# Patient Record
Sex: Female | Born: 1965
Health system: Southern US, Community
[De-identification: ages and names within clinical notes are randomized; demographics above are authoritative.]

## PROBLEM LIST (undated history)

## (undated) DIAGNOSIS — M5030 Other cervical disc degeneration, unspecified cervical region: Secondary | ICD-10-CM

## (undated) DIAGNOSIS — K219 Gastro-esophageal reflux disease without esophagitis: Secondary | ICD-10-CM

## (undated) DIAGNOSIS — M8580 Other specified disorders of bone density and structure, unspecified site: Secondary | ICD-10-CM

## (undated) DIAGNOSIS — M1990 Unspecified osteoarthritis, unspecified site: Secondary | ICD-10-CM

## (undated) DIAGNOSIS — E282 Polycystic ovarian syndrome: Secondary | ICD-10-CM

## (undated) DIAGNOSIS — T7840XA Allergy, unspecified, initial encounter: Secondary | ICD-10-CM

## (undated) DIAGNOSIS — I10 Essential (primary) hypertension: Secondary | ICD-10-CM

## (undated) DIAGNOSIS — Z8489 Family history of other specified conditions: Secondary | ICD-10-CM

## (undated) DIAGNOSIS — C801 Malignant (primary) neoplasm, unspecified: Secondary | ICD-10-CM

## (undated) HISTORY — DX: Other specified disorders of bone density and structure, unspecified site: M85.80

## (undated) HISTORY — DX: Other cervical disc degeneration, unspecified cervical region: M50.30

## (undated) HISTORY — PX: BREAST ENHANCEMENT SURGERY: SHX7

## (undated) HISTORY — DX: Polycystic ovarian syndrome: E28.2

## (undated) HISTORY — DX: Unspecified osteoarthritis, unspecified site: M19.90

## (undated) HISTORY — PX: HAND SURGERY: SHX662

## (undated) HISTORY — DX: Allergy, unspecified, initial encounter: T78.40XA

## (undated) HISTORY — PX: TONSILLECTOMY: SUR1361

---

## 2018-06-02 DIAGNOSIS — C801 Malignant (primary) neoplasm, unspecified: Secondary | ICD-10-CM | POA: Diagnosis not present

## 2018-06-02 DIAGNOSIS — F419 Anxiety disorder, unspecified: Secondary | ICD-10-CM | POA: Diagnosis not present

## 2018-06-02 DIAGNOSIS — G43909 Migraine, unspecified, not intractable, without status migrainosus: Secondary | ICD-10-CM | POA: Diagnosis not present

## 2018-06-02 DIAGNOSIS — F5101 Primary insomnia: Secondary | ICD-10-CM | POA: Diagnosis not present

## 2018-07-11 DIAGNOSIS — F3342 Major depressive disorder, recurrent, in full remission: Secondary | ICD-10-CM | POA: Diagnosis not present

## 2018-08-03 DIAGNOSIS — M542 Cervicalgia: Secondary | ICD-10-CM | POA: Diagnosis not present

## 2018-08-03 DIAGNOSIS — M79606 Pain in leg, unspecified: Secondary | ICD-10-CM | POA: Diagnosis not present

## 2018-08-09 DIAGNOSIS — M25562 Pain in left knee: Secondary | ICD-10-CM | POA: Diagnosis not present

## 2018-08-09 DIAGNOSIS — G43009 Migraine without aura, not intractable, without status migrainosus: Secondary | ICD-10-CM | POA: Diagnosis not present

## 2018-08-09 DIAGNOSIS — M47812 Spondylosis without myelopathy or radiculopathy, cervical region: Secondary | ICD-10-CM | POA: Diagnosis not present

## 2018-08-09 DIAGNOSIS — Z79891 Long term (current) use of opiate analgesic: Secondary | ICD-10-CM | POA: Diagnosis not present

## 2018-08-09 DIAGNOSIS — G894 Chronic pain syndrome: Secondary | ICD-10-CM | POA: Diagnosis not present

## 2018-09-05 DIAGNOSIS — G894 Chronic pain syndrome: Secondary | ICD-10-CM | POA: Diagnosis not present

## 2018-09-05 DIAGNOSIS — M25562 Pain in left knee: Secondary | ICD-10-CM | POA: Diagnosis not present

## 2018-09-05 DIAGNOSIS — M47812 Spondylosis without myelopathy or radiculopathy, cervical region: Secondary | ICD-10-CM | POA: Diagnosis not present

## 2018-09-05 DIAGNOSIS — G43009 Migraine without aura, not intractable, without status migrainosus: Secondary | ICD-10-CM | POA: Diagnosis not present

## 2018-09-12 DIAGNOSIS — F3342 Major depressive disorder, recurrent, in full remission: Secondary | ICD-10-CM | POA: Diagnosis not present

## 2018-10-04 DIAGNOSIS — G894 Chronic pain syndrome: Secondary | ICD-10-CM | POA: Diagnosis not present

## 2018-10-04 DIAGNOSIS — M47812 Spondylosis without myelopathy or radiculopathy, cervical region: Secondary | ICD-10-CM | POA: Diagnosis not present

## 2018-10-04 DIAGNOSIS — M25562 Pain in left knee: Secondary | ICD-10-CM | POA: Diagnosis not present

## 2018-10-04 DIAGNOSIS — G43009 Migraine without aura, not intractable, without status migrainosus: Secondary | ICD-10-CM | POA: Diagnosis not present

## 2018-11-01 DIAGNOSIS — M47812 Spondylosis without myelopathy or radiculopathy, cervical region: Secondary | ICD-10-CM | POA: Diagnosis not present

## 2018-11-01 DIAGNOSIS — M25562 Pain in left knee: Secondary | ICD-10-CM | POA: Diagnosis not present

## 2018-11-01 DIAGNOSIS — G43009 Migraine without aura, not intractable, without status migrainosus: Secondary | ICD-10-CM | POA: Diagnosis not present

## 2018-11-01 DIAGNOSIS — G894 Chronic pain syndrome: Secondary | ICD-10-CM | POA: Diagnosis not present

## 2018-11-30 DIAGNOSIS — G43009 Migraine without aura, not intractable, without status migrainosus: Secondary | ICD-10-CM | POA: Diagnosis not present

## 2018-11-30 DIAGNOSIS — G894 Chronic pain syndrome: Secondary | ICD-10-CM | POA: Diagnosis not present

## 2018-11-30 DIAGNOSIS — M47812 Spondylosis without myelopathy or radiculopathy, cervical region: Secondary | ICD-10-CM | POA: Diagnosis not present

## 2018-11-30 DIAGNOSIS — M25562 Pain in left knee: Secondary | ICD-10-CM | POA: Diagnosis not present

## 2018-12-12 DIAGNOSIS — F3342 Major depressive disorder, recurrent, in full remission: Secondary | ICD-10-CM | POA: Diagnosis not present

## 2018-12-28 DIAGNOSIS — M25562 Pain in left knee: Secondary | ICD-10-CM | POA: Diagnosis not present

## 2018-12-28 DIAGNOSIS — G894 Chronic pain syndrome: Secondary | ICD-10-CM | POA: Diagnosis not present

## 2018-12-28 DIAGNOSIS — M47812 Spondylosis without myelopathy or radiculopathy, cervical region: Secondary | ICD-10-CM | POA: Diagnosis not present

## 2018-12-28 DIAGNOSIS — G43009 Migraine without aura, not intractable, without status migrainosus: Secondary | ICD-10-CM | POA: Diagnosis not present

## 2019-01-25 DIAGNOSIS — G43009 Migraine without aura, not intractable, without status migrainosus: Secondary | ICD-10-CM | POA: Diagnosis not present

## 2019-01-25 DIAGNOSIS — G894 Chronic pain syndrome: Secondary | ICD-10-CM | POA: Diagnosis not present

## 2019-01-25 DIAGNOSIS — M47812 Spondylosis without myelopathy or radiculopathy, cervical region: Secondary | ICD-10-CM | POA: Diagnosis not present

## 2019-01-25 DIAGNOSIS — M25562 Pain in left knee: Secondary | ICD-10-CM | POA: Diagnosis not present

## 2019-01-26 DIAGNOSIS — E282 Polycystic ovarian syndrome: Secondary | ICD-10-CM | POA: Diagnosis not present

## 2019-01-26 DIAGNOSIS — C801 Malignant (primary) neoplasm, unspecified: Secondary | ICD-10-CM | POA: Diagnosis not present

## 2019-01-26 DIAGNOSIS — G43909 Migraine, unspecified, not intractable, without status migrainosus: Secondary | ICD-10-CM | POA: Diagnosis not present

## 2019-01-26 DIAGNOSIS — L700 Acne vulgaris: Secondary | ICD-10-CM | POA: Diagnosis not present

## 2019-02-09 ENCOUNTER — Other Ambulatory Visit (HOSPITAL_COMMUNITY)
Admission: RE | Admit: 2019-02-09 | Discharge: 2019-02-09 | Disposition: A | Discharge status: Home / Self Care | Payer: Self-pay | Source: Ambulatory Visit | Attending: Obstetrics and Gynecology | Admitting: Obstetrics and Gynecology

## 2019-02-09 ENCOUNTER — Other Ambulatory Visit: Payer: Self-pay | Admitting: Obstetrics and Gynecology

## 2019-02-09 DIAGNOSIS — N959 Unspecified menopausal and perimenopausal disorder: Secondary | ICD-10-CM | POA: Diagnosis not present

## 2019-02-09 DIAGNOSIS — Z124 Encounter for screening for malignant neoplasm of cervix: Secondary | ICD-10-CM | POA: Insufficient documentation

## 2019-02-09 DIAGNOSIS — E282 Polycystic ovarian syndrome: Secondary | ICD-10-CM | POA: Diagnosis not present

## 2019-02-09 DIAGNOSIS — M8580 Other specified disorders of bone density and structure, unspecified site: Secondary | ICD-10-CM | POA: Diagnosis not present

## 2019-02-09 DIAGNOSIS — Z01419 Encounter for gynecological examination (general) (routine) without abnormal findings: Secondary | ICD-10-CM | POA: Diagnosis not present

## 2019-02-11 LAB — CYTOLOGY - PAP
Diagnosis: NEGATIVE
HPV: NOT DETECTED

## 2019-02-22 DIAGNOSIS — Z79891 Long term (current) use of opiate analgesic: Secondary | ICD-10-CM | POA: Diagnosis not present

## 2019-02-22 DIAGNOSIS — M47812 Spondylosis without myelopathy or radiculopathy, cervical region: Secondary | ICD-10-CM | POA: Diagnosis not present

## 2019-02-22 DIAGNOSIS — M25562 Pain in left knee: Secondary | ICD-10-CM | POA: Diagnosis not present

## 2019-02-22 DIAGNOSIS — G43009 Migraine without aura, not intractable, without status migrainosus: Secondary | ICD-10-CM | POA: Diagnosis not present

## 2019-02-22 DIAGNOSIS — G894 Chronic pain syndrome: Secondary | ICD-10-CM | POA: Diagnosis not present

## 2019-02-27 DIAGNOSIS — E673 Hypervitaminosis D: Secondary | ICD-10-CM | POA: Diagnosis not present

## 2019-03-20 DIAGNOSIS — R7309 Other abnormal glucose: Secondary | ICD-10-CM | POA: Diagnosis not present

## 2019-03-21 DIAGNOSIS — M47812 Spondylosis without myelopathy or radiculopathy, cervical region: Secondary | ICD-10-CM | POA: Diagnosis not present

## 2019-03-21 DIAGNOSIS — M25562 Pain in left knee: Secondary | ICD-10-CM | POA: Diagnosis not present

## 2019-03-21 DIAGNOSIS — G894 Chronic pain syndrome: Secondary | ICD-10-CM | POA: Diagnosis not present

## 2019-03-21 DIAGNOSIS — G43009 Migraine without aura, not intractable, without status migrainosus: Secondary | ICD-10-CM | POA: Diagnosis not present

## 2019-03-28 DIAGNOSIS — L299 Pruritus, unspecified: Secondary | ICD-10-CM | POA: Diagnosis not present

## 2019-03-28 DIAGNOSIS — T7840XA Allergy, unspecified, initial encounter: Secondary | ICD-10-CM | POA: Diagnosis not present

## 2019-03-28 DIAGNOSIS — R0602 Shortness of breath: Secondary | ICD-10-CM | POA: Diagnosis not present

## 2019-03-28 DIAGNOSIS — R000 Tachycardia, unspecified: Secondary | ICD-10-CM | POA: Diagnosis not present

## 2019-04-10 DIAGNOSIS — F3342 Major depressive disorder, recurrent, in full remission: Secondary | ICD-10-CM | POA: Diagnosis not present

## 2019-04-12 DIAGNOSIS — M79671 Pain in right foot: Secondary | ICD-10-CM | POA: Diagnosis not present

## 2019-04-13 ENCOUNTER — Other Ambulatory Visit: Payer: Self-pay

## 2019-04-17 ENCOUNTER — Other Ambulatory Visit: Payer: Self-pay | Admitting: Family Medicine

## 2019-04-17 ENCOUNTER — Ambulatory Visit
Admission: RE | Admit: 2019-04-17 | Discharge: 2019-04-17 | Disposition: A | Discharge status: Home / Self Care | Payer: BLUE CROSS/BLUE SHIELD | Source: Ambulatory Visit | Attending: Family Medicine | Admitting: Family Medicine

## 2019-04-17 ENCOUNTER — Other Ambulatory Visit: Payer: Self-pay

## 2019-04-17 ENCOUNTER — Encounter: Payer: Self-pay | Admitting: Internal Medicine

## 2019-04-17 ENCOUNTER — Ambulatory Visit (INDEPENDENT_AMBULATORY_CARE_PROVIDER_SITE_OTHER): Payer: BLUE CROSS/BLUE SHIELD | Admitting: Internal Medicine

## 2019-04-17 ENCOUNTER — Telehealth: Payer: Self-pay

## 2019-04-17 VITALS — BP 128/74 | HR 98 | Resp 16 | Ht 61.0 in | Wt 102.8 lb

## 2019-04-17 DIAGNOSIS — E282 Polycystic ovarian syndrome: Secondary | ICD-10-CM | POA: Diagnosis not present

## 2019-04-17 DIAGNOSIS — M79671 Pain in right foot: Secondary | ICD-10-CM

## 2019-04-17 DIAGNOSIS — E673 Hypervitaminosis D: Secondary | ICD-10-CM | POA: Diagnosis not present

## 2019-04-17 LAB — COMPREHENSIVE METABOLIC PANEL
ALT: 15 U/L (ref 0–35)
AST: 14 U/L (ref 0–37)
Albumin: 4.2 g/dL (ref 3.5–5.2)
Alkaline Phosphatase: 41 U/L (ref 39–117)
BUN: 18 mg/dL (ref 6–23)
CO2: 27 mEq/L (ref 19–32)
Calcium: 9.4 mg/dL (ref 8.4–10.5)
Chloride: 104 mEq/L (ref 96–112)
Creatinine, Ser: 0.95 mg/dL (ref 0.40–1.20)
GFR: 61.11 mL/min (ref >60.00)
Glucose, Bld: 82 mg/dL (ref 70–99)
Potassium: 3.4 mEq/L — ABNORMAL LOW (ref 3.5–5.1)
Sodium: 140 mEq/L (ref 135–145)
Total Bilirubin: 0.3 mg/dL (ref 0.2–1.2)
Total Protein: 6.7 g/dL (ref 6.0–8.3)

## 2019-04-17 LAB — T4, FREE: Free T4: 0.91 ng/dL (ref 0.60–1.60)

## 2019-04-17 LAB — PHOSPHORUS: Phosphorus: 2.4 mg/dL (ref 2.3–4.6)

## 2019-04-17 LAB — VITAMIN D 25 HYDROXY (VIT D DEFICIENCY, FRACTURES): VITD: >120 ng/mL

## 2019-04-17 LAB — TSH: TSH: 0.47 u[IU]/mL (ref 0.35–4.50)

## 2019-04-17 MED ORDER — METFORMIN HCL ER 500 MG PO TB24: 1500.0000 mg | ORAL_TABLET | Freq: Every day | ORAL | 3 refills | Status: DC

## 2019-04-17 NOTE — Progress Notes (Signed)
Name: Shelly Myers  MRN/ DOB: [id number], [birth year]    Age/ Sex: 53 y.o., female    PCP: Shelly Lund, PA   Reason for Endocrinology Evaluation: Hypervitaminosis D     Date of Initial Endocrinology Evaluation: 04/17/2019     HPI: Shelly Myers is a 53 y.o. female with a past medical history of insomnia,hx of PCOS, migraine headaches and anxiety . The patient presented for initial endocrinology clinic visit on 04/17/2019 for consultative assistance with her elevated Vitamin D   Pt has been referred by Dr. Dion Body for elevated Vitamin D levels > 120 ng/dL   She denies hypercalcemia, polyuria or polydipsia Denies confusion or renal stones  Has been diagnosed with osteopenia. Has been on OTC calcium and vitamin D , takes 1 tabs daily- stopped after elevated Vitamin D results  She consumes dairy on average 2-3 servings per day.  They own a tanning bed and uses it once a week and uses a sunscreen only on her chest.  Pt has been diagnosed with PCOS in [place] , was following with Endo there. Has been on Metformin for years.  Has 1 child without difficulty conceiving       HISTORY:  Past Medical History:  Past Medical History:  Diagnosis Date  . Allergy   . Arthritis   . DDD (degenerative disc disease), cervical   . Osteopenia   . PCOS (polycystic ovarian syndrome)     Past Surgical History:  N/A  Social History:  reports that she has never smoked. She has never used smokeless tobacco.  Family History: family history includes Diabetes in her father; Esophageal cancer in her father; Hyperlipidemia in her father; Hypertension in her father and mother; Osteopenia in her mother; Prostate cancer in her father; Skin cancer in her father.   HOME MEDICATIONS: Allergies as of 04/17/2019   No Known Allergies     Medication List       Accurate as of April 17, 2019  2:39 PM. If you have any questions, ask your nurse or doctor.        Aimovig 140 MG/ML Soaj  Generic drug: Erenumab-aooe Inject into the skin every 30 (thirty) days.   ALPRAZolam 1 MG tablet Commonly known as: XANAX Take 2 mg by mouth at bedtime.   clindamycin 1 % external solution Commonly known as: CLEOCIN T APPLY TWICE A DAY EXTERNALLY 30 DAYS   cyclobenzaprine 10 MG tablet Commonly known as: FLEXERIL TAKE 1/2 TABLET BY MOUTH EVERY MORNING AND AT NOON, AND TAKE 1 TABLET EVERY DAY AT BEDTIME   EPINEPHrine 0.3 mg/0.3 mL Soaj injection Commonly known as: EPI-PEN INJECT AS DIRECTED FOR LIFE THREATENING REACTION   fluticasone 50 MCG/ACT nasal spray Commonly known as: FLONASE Place 2 sprays into both nostrils as needed.   meloxicam 15 MG tablet Commonly known as: MOBIC Take 15 mg by mouth daily.   metFORMIN 500 MG 24 hr tablet Commonly known as: GLUCOPHAGE-XR Take 1,500 mg by mouth daily.   Nurtec 75 MG Tbdp Generic drug: Rimegepant Sulfate Take 1 tablet by mouth daily as needed.   nystatin-triamcinolone ointment Commonly known as: MYCOLOG APPLY TOPICALLY TO THE AFFECTED AREA 2 TIMES A DAY.   oxyCODONE-acetaminophen 10-325 MG tablet Commonly known as: PERCOCET Take 1 tablet by mouth 2 (two) times daily as needed.   Prempro 0.45-1.5 MG tablet Generic drug: estrogen (conjugated)-medroxyprogesterone Take 1 tablet by mouth daily.   traZODone 150 MG tablet Commonly known as: DESYREL Take 300 mg by  mouth at bedtime.   triamcinolone cream 0.5 % Commonly known as: KENALOG APPLY TO AFFECTED AREA TWICE DAILY AS NEEDED   Trintellix 20 MG Tabs tablet Generic drug: vortioxetine HBr Take 20 mg by mouth daily.   Xtampza ER 18 MG C12a Generic drug: oxyCODONE ER Take 1 capsule by mouth every 12 (twelve) hours.         REVIEW OF SYSTEMS: A comprehensive ROS was conducted with the patient and is negative except as per HPI and below:  Review of Systems  Constitutional: Negative for malaise/fatigue and weight loss.  HENT: Negative for congestion and sore  throat.   Respiratory: Negative for cough and shortness of breath.   Cardiovascular: Negative for chest pain and palpitations.  Gastrointestinal: Positive for constipation. Negative for diarrhea and nausea.  Genitourinary: Negative for frequency.  Neurological: Positive for headaches. Negative for tingling.  Endo/Heme/Allergies: Negative for polydipsia.  Psychiatric/Behavioral: Negative for depression. The patient is not nervous/anxious.        OBJECTIVE:  VS: BP 128/74   Pulse 98   Resp 16   Ht 5\' 1"  (1.549 m)   Wt 102 lb 12.8 oz (46.6 kg)   SpO2 96%   BMI 19.42 kg/m    Wt Readings from Last 3 Encounters:  04/17/19 102 lb 12.8 oz (46.6 kg)     EXAM: General: Pt appears well and is in NAD  Hydration: Well-hydrated with moist mucous membranes and good skin turgor  Eyes: External eye exam normal without stare, lid lag or exophthalmos.  EOM intact.  PERRL.  Ears, Nose, Throat: Hearing: Grossly intact bilaterally Dental: Good dentition  Throat: Clear without mass, erythema or exudate  Neck: General: Supple without adenopathy. Thyroid: Thyroid size normal.  No goiter or nodules appreciated. No thyroid bruit.  Lungs: Clear with good BS bilat with no rales, rhonchi, or wheezes  Heart: Auscultation: RRR.  Abdomen: Normoactive bowel sounds, soft, nontender, without masses or organomegaly palpable  Extremities:  BL LE: No pretibial edema normal ROM and strength.  Skin: Hair: Texture and amount normal with gender appropriate distribution Skin Inspection: No rashes,excessively tanned Skin Palpation: Skin temperature, texture, and thickness normal to palpation  Neuro: Cranial nerves: II - XII grossly intact  Motor: Normal strength throughout DTRs: 2+ and symmetric in UE without delay in relaxation phase  Mental Status: Judgment, insight: Intact Orientation: Oriented to time, place, and person Mood and affect: No depression, anxiety, or agitation     DATA REVIEWED:  Results  for [name], [name] (MRN [id number]) as of 04/18/2019 09:51  Ref. Range 04/17/2019 14:58  Sodium Latest Ref Range: 135 - 145 mEq/L 140  Potassium Latest Ref Range: 3.5 - 5.1 mEq/L 3.4 (L)  Chloride Latest Ref Range: 96 - 112 mEq/L 104  CO2 Latest Ref Range: 19 - 32 mEq/L 27  Glucose Latest Ref Range: 70 - 99 mg/dL 82  BUN Latest Ref Range: 6 - 23 mg/dL 18  Creatinine Latest Ref Range: 0.40 - 1.20 mg/dL 0.95  Calcium Latest Ref Range: 8.4 - 10.5 mg/dL 9.4  Phosphorus Latest Ref Range: 2.3 - 4.6 mg/dL 2.4  Alkaline Phosphatase Latest Ref Range: 39 - 117 U/L 41  Albumin Latest Ref Range: 3.5 - 5.2 g/dL 4.2  AST Latest Ref Range: 0 - 37 U/L 14  ALT Latest Ref Range: 0 - 35 U/L 15  Total Protein Latest Ref Range: 6.0 - 8.3 g/dL 6.7  Total Bilirubin Latest Ref Range: 0.2 - 1.2 mg/dL 0.3  GFR Latest Ref Range: >  60.00 mL/min 61.11  VITD Latest Units: ng/mL >120  TSH Latest Ref Range: 0.35 - 4.50 uIU/mL 0.47  T4,Free(Direct) Latest Ref Range: 0.60 - 1.60 ng/dL 4.090.91     8/11/919/10/20 Vitamin D 120 ng/mL  A1c 5.1%  ASSESSMENT/PLAN/RECOMMENDATIONS:   1. Hypervitaminosis D:  - We discussed the risk of hypervitaminosis D in causing hypercalcemia and renal stones, with the subsequent risk of renal damage.  - Pt had already stopped all OTC renal supplements , repeat Vitamin D continues to be > 120 ng/mL, pt will be advised to stop tanning completely until her next check up in 3 months    2. PCOS:   - This was diagnosed years ago. Was seen by Endo in [place] - Pt is on metformin and HRT. Pt would like to continue on metformin, she feels this has helped keep her weight down.    Medications : Metformin 500 mg, 3 tabs daily     F/U in 3 months     Signed electronically by: Lyndle HerrlichAbby Jaralla Manolito Jurewicz, MD  [facility]  [facility] [address] [place], [place] [address] Phone: [phone] FAX: [phone]   CC: Shelly LundBecker, Anna G, [place] [address]  [place] [id number] Phone: [phone] Fax: [phone]   Return to Endocrinology clinic as below: No future appointments.

## 2019-04-17 NOTE — Telephone Encounter (Signed)
Main lab called with a critical vit d greater than 120.  Also sent Dr. Dwyane Dee a message since he's on call.

## 2019-04-18 ENCOUNTER — Telehealth: Payer: Self-pay | Admitting: Internal Medicine

## 2019-04-18 LAB — PARATHYROID HORMONE, INTACT (NO CA): PTH: 16 pg/mL (ref 14–64)

## 2019-04-18 NOTE — Telephone Encounter (Signed)
Attempted to reach patient. No answer. Vm left to call back  

## 2019-04-18 NOTE — Telephone Encounter (Signed)
Please let her know that her Vitamin D is still extremely high at over 120. I recommend that she completely stops tanning until her visit here.    Thanks  Abby Nena Jordan, MD  [facility]  [facility] [address] [place], [place] [address] Phone: [phone] FAX: [phone]

## 2019-04-18 NOTE — Telephone Encounter (Signed)
Spoke with pt about their result. Pt understood and had no further questions. Nothing further is needed.' 

## 2019-04-19 ENCOUNTER — Encounter: Payer: Self-pay | Admitting: Internal Medicine

## 2019-04-19 DIAGNOSIS — M47812 Spondylosis without myelopathy or radiculopathy, cervical region: Secondary | ICD-10-CM | POA: Diagnosis not present

## 2019-04-19 DIAGNOSIS — M25562 Pain in left knee: Secondary | ICD-10-CM | POA: Diagnosis not present

## 2019-04-19 DIAGNOSIS — G43009 Migraine without aura, not intractable, without status migrainosus: Secondary | ICD-10-CM | POA: Diagnosis not present

## 2019-04-19 DIAGNOSIS — G894 Chronic pain syndrome: Secondary | ICD-10-CM | POA: Diagnosis not present

## 2019-04-19 NOTE — Progress Notes (Signed)
Letter sent.

## 2019-05-17 DIAGNOSIS — M25562 Pain in left knee: Secondary | ICD-10-CM | POA: Diagnosis not present

## 2019-05-17 DIAGNOSIS — G894 Chronic pain syndrome: Secondary | ICD-10-CM | POA: Diagnosis not present

## 2019-05-17 DIAGNOSIS — M47812 Spondylosis without myelopathy or radiculopathy, cervical region: Secondary | ICD-10-CM | POA: Diagnosis not present

## 2019-05-17 DIAGNOSIS — G43009 Migraine without aura, not intractable, without status migrainosus: Secondary | ICD-10-CM | POA: Diagnosis not present

## 2019-06-06 DIAGNOSIS — H6983 Other specified disorders of Eustachian tube, bilateral: Secondary | ICD-10-CM | POA: Diagnosis not present

## 2019-06-06 DIAGNOSIS — H9193 Unspecified hearing loss, bilateral: Secondary | ICD-10-CM | POA: Diagnosis not present

## 2019-06-15 DIAGNOSIS — M47812 Spondylosis without myelopathy or radiculopathy, cervical region: Secondary | ICD-10-CM | POA: Diagnosis not present

## 2019-06-15 DIAGNOSIS — M25562 Pain in left knee: Secondary | ICD-10-CM | POA: Diagnosis not present

## 2019-06-15 DIAGNOSIS — G43009 Migraine without aura, not intractable, without status migrainosus: Secondary | ICD-10-CM | POA: Diagnosis not present

## 2019-06-15 DIAGNOSIS — G894 Chronic pain syndrome: Secondary | ICD-10-CM | POA: Diagnosis not present

## 2019-07-13 DIAGNOSIS — M25562 Pain in left knee: Secondary | ICD-10-CM | POA: Diagnosis not present

## 2019-07-13 DIAGNOSIS — G43009 Migraine without aura, not intractable, without status migrainosus: Secondary | ICD-10-CM | POA: Diagnosis not present

## 2019-07-13 DIAGNOSIS — M47812 Spondylosis without myelopathy or radiculopathy, cervical region: Secondary | ICD-10-CM | POA: Diagnosis not present

## 2019-07-13 DIAGNOSIS — G894 Chronic pain syndrome: Secondary | ICD-10-CM | POA: Diagnosis not present

## 2019-07-17 ENCOUNTER — Encounter: Payer: Self-pay | Admitting: Internal Medicine

## 2019-07-17 ENCOUNTER — Ambulatory Visit (INDEPENDENT_AMBULATORY_CARE_PROVIDER_SITE_OTHER): Payer: BLUE CROSS/BLUE SHIELD | Admitting: Internal Medicine

## 2019-07-17 ENCOUNTER — Other Ambulatory Visit: Payer: Self-pay

## 2019-07-17 VITALS — BP 128/68 | HR 107 | Temp 98.4°F | Ht 62.0 in | Wt 105.4 lb

## 2019-07-17 DIAGNOSIS — M8580 Other specified disorders of bone density and structure, unspecified site: Secondary | ICD-10-CM | POA: Diagnosis not present

## 2019-07-17 DIAGNOSIS — E673 Hypervitaminosis D: Secondary | ICD-10-CM | POA: Diagnosis not present

## 2019-07-17 LAB — COMPREHENSIVE METABOLIC PANEL
ALT: 16 U/L (ref 0–35)
AST: 21 U/L (ref 0–37)
Albumin: 4 g/dL (ref 3.5–5.2)
Alkaline Phosphatase: 41 U/L (ref 39–117)
BUN: 23 mg/dL (ref 6–23)
CO2: 28 mEq/L (ref 19–32)
Calcium: 9.4 mg/dL (ref 8.4–10.5)
Chloride: 98 mEq/L (ref 96–112)
Creatinine, Ser: 1.25 mg/dL — ABNORMAL HIGH (ref 0.40–1.20)
GFR: 44.48 mL/min — ABNORMAL LOW (ref >60.00)
Glucose, Bld: 142 mg/dL — ABNORMAL HIGH (ref 70–99)
Potassium: 4.1 mEq/L (ref 3.5–5.1)
Sodium: 135 mEq/L (ref 135–145)
Total Bilirubin: 0.2 mg/dL (ref 0.2–1.2)
Total Protein: 6.5 g/dL (ref 6.0–8.3)

## 2019-07-17 LAB — VITAMIN D 25 HYDROXY (VIT D DEFICIENCY, FRACTURES): VITD: >120 ng/mL (ref 30.00–100.00)

## 2019-07-17 LAB — MAGNESIUM: Magnesium: 2 mg/dL (ref 1.5–2.5)

## 2019-07-17 LAB — PHOSPHORUS: Phosphorus: 3.7 mg/dL (ref 2.3–4.6)

## 2019-07-17 NOTE — Patient Instructions (Signed)
-   We will schedule you for bone density - Stop by the lab today

## 2019-07-17 NOTE — Progress Notes (Signed)
Name: Shelly Myers  MRN/ DOB: [id number], 1966-04-11    Age/ Sex: 54 y.o., female     PCP: Wilfrid Lund, PA   Reason for Endocrinology Evaluation: Hypervitaminosis D     Initial Endocrinology Clinic Visit: 04/17/2019    PATIENT IDENTIFIER: Shelly Myers is a 54 y.o., female with a past medical history of Hx of PCOS, migraine headaches and insomnia. She has followed with [facility] since 04/17/2019 for consultative assistance with management of her hypervitaminosis.   HISTORICAL SUMMARY:  Pt has been referred by Dr. Dion Body for elevated Vitamin D levels > 120 ng/dL . This was attributed to excessive tanning.     Pt has been diagnosed with PCOS in [place] , was following with Endo there. Has been on Metformin for years.  Has 1 child without difficulty conceiving    SUBJECTIVE:   During last visit (04/17/2019): She was advised to completely stop tanning.   Today (07/18/2019):  [name] is here for a follow up on hypervitaminosis D. She continues to tan less frequently though on average every 2-3 weeks, last one was a week and half a go   Denies carpopedal spasms - she does have muscle tensions with hand use   Has chronic constipation due to opoid use    Has history of osteopenia many years ago   She has not been on calcium since stopping the vitamin D supplements.   She has noted decrease in height   Mother with osteopenia / osteoporosis but on hip fracture  Menarche at age 47 . Was on OCP's until the age 37 yrs.     ROS:  As per HPI.   HISTORY:  Past Medical History:  Past Medical History:  Diagnosis Date  . Allergy   . Arthritis   . DDD (degenerative disc disease), cervical   . Osteopenia   . PCOS (polycystic ovarian syndrome)     Past Surgical History:   Social History:  reports that she has never smoked. She has never used smokeless tobacco. No history on file for alcohol and drug. Family History:  Family History   Problem Relation Age of Onset  . Hypertension Mother   . Osteopenia Mother   . Diabetes Father   . Skin cancer Father   . Prostate cancer Father   . Esophageal cancer Father   . Hypertension Father   . Hyperlipidemia Father      HOME MEDICATIONS: Allergies as of 07/17/2019   No Known Allergies     Medication List       Accurate as of July 17, 2019 11:59 PM. If you have any questions, ask your nurse or doctor.        Aimovig 140 MG/ML Soaj Generic drug: Erenumab-aooe Inject into the skin every 30 (thirty) days.   ALPRAZolam 1 MG tablet Commonly known as: XANAX Take 2 mg by mouth at bedtime.   clindamycin 1 % external solution Commonly known as: CLEOCIN T APPLY TWICE A DAY EXTERNALLY 30 DAYS   cyclobenzaprine 10 MG tablet Commonly known as: FLEXERIL TAKE 1/2 TABLET BY MOUTH EVERY MORNING AND AT NOON, AND TAKE 1 TABLET EVERY DAY AT BEDTIME   EPINEPHrine 0.3 mg/0.3 mL Soaj injection Commonly known as: EPI-PEN INJECT AS DIRECTED FOR LIFE THREATENING REACTION   fluticasone 50 MCG/ACT nasal spray Commonly known as: FLONASE Place 2 sprays into both nostrils as needed.   meloxicam 15 MG tablet Commonly known as: MOBIC Take 15 mg by mouth daily.  metFORMIN 500 MG 24 hr tablet Commonly known as: GLUCOPHAGE-XR Take 3 tablets (1,500 mg total) by mouth daily.   Nurtec 75 MG Tbdp Generic drug: Rimegepant Sulfate Take 1 tablet by mouth daily as needed.   nystatin-triamcinolone ointment Commonly known as: MYCOLOG APPLY TOPICALLY TO THE AFFECTED AREA 2 TIMES A DAY.   oxyCODONE-acetaminophen 10-325 MG tablet Commonly known as: PERCOCET Take 1 tablet by mouth 2 (two) times daily as needed.   Prempro 0.45-1.5 MG tablet Generic drug: estrogen (conjugated)-medroxyprogesterone Take 1 tablet by mouth daily.   traZODone 150 MG tablet Commonly known as: DESYREL Take 300 mg by mouth at bedtime.   triamcinolone cream 0.5 % Commonly known as: KENALOG APPLY TO  AFFECTED AREA TWICE DAILY AS NEEDED   Trintellix 20 MG Tabs tablet Generic drug: vortioxetine HBr Take 20 mg by mouth daily.   Xtampza ER 18 MG C12a Generic drug: oxyCODONE ER Take 1 capsule by mouth every 12 (twelve) hours.         OBJECTIVE:   PHYSICAL EXAM: VS: BP 128/68 (BP Location: Left Arm, Patient Position: Sitting, Cuff Size: Normal)   Pulse (!) 107   Temp 98.4 F (36.9 C)   Ht 5\' 2"  (1.575 m)   Wt 105 lb 6.4 oz (47.8 kg)   SpO2 95%   BMI 19.28 kg/m    EXAM: General: Pt appears well and is in NAD  Neck: General: Supple without adenopathy. Thyroid: Thyroid size normal.  No goiter or nodules appreciated. No thyroid bruit.  Lungs: Clear with good BS bilat with no rales, rhonchi, or wheezes  Heart: Auscultation: RRR.  Abdomen: Normoactive bowel sounds, soft, nontender, without masses or organomegaly palpable  Extremities:  BL LE: No pretibial edema normal ROM and strength.  Skin: Evidence of tanning noted more over the lower extremities  Mental Status: Judgment, insight: Intact Orientation: Oriented to time, place, and person Mood and affect: No depression, anxiety, or agitation     DATA REVIEWED:  Results for ALEXIE, LANNI (MRN Gardiner Barefoot) as of 07/21/2019 09:34  Ref. Range 07/17/2019 13:33  Sodium Latest Ref Range: 135 - 145 mEq/L 135  Potassium Latest Ref Range: 3.5 - 5.1 mEq/L 4.1  Chloride Latest Ref Range: 96 - 112 mEq/L 98  CO2 Latest Ref Range: 19 - 32 mEq/L 28  Glucose Latest Ref Range: 70 - 99 mg/dL 07/19/2019 (H)  BUN Latest Ref Range: 6 - 23 mg/dL 23  Creatinine Latest Ref Range: 0.40 - 1.20 mg/dL 937 (H)  Calcium Latest Ref Range: 8.4 - 10.5 mg/dL 9.4  Phosphorus Latest Ref Range: 2.3 - 4.6 mg/dL 3.7  Magnesium Latest Ref Range: 1.5 - 2.5 mg/dL 2.0  Alkaline Phosphatase Latest Ref Range: 39 - 117 U/L 41  Albumin Latest Ref Range: 3.5 - 5.2 g/dL 4.0  AST Latest Ref Range: 0 - 37 U/L 21  ALT Latest Ref Range: 0 - 35 U/L 16  Total Protein Latest  Ref Range: 6.0 - 8.3 g/dL 6.5  Total Bilirubin Latest Ref Range: 0.2 - 1.2 mg/dL 0.2  GFR Latest Ref Range: >60.00 mL/min 44.48 (L)  VITD Latest Ref Range: 30.00 - 100.00 ng/mL >120.00 (HH)  PTH, Intact Latest Ref Range: 14 - 64 pg/mL 15     ASSESSMENT / PLAN / RECOMMENDATIONS:   1. Hypervitaminsosis D:   -Unfortunately the patient continues to tan, despite reduction in frequency per the patient , her vitamin D continues to be extremely elevated.  Patient will again be advised to COMPLETELY stop tanning. -As  long as she continues to 10 will be difficult for me to further early evaluate her hypovitaminosis status. -We again discussed the risk of hypercalcemia, nephrocalcinosis and hypercalciuria in the setting of hypervitaminosis D. -She currently does not have any GI symptoms such as nausea or constipation.   2.  Low bone density:   -She has been previously diagnosed with osteopenia, no recent bone density, patient will be scheduled for repeat DEXA.  3.  Low GFR:   -This is a new finding on today's labs, unclear cause at this time.  We will repeat labs in 2 months.  Follow-up in 6 months   Signed electronically by: Mack Guise, MD  [facility]  [facility] [address] [place], [place] [address] Phone: [phone] FAX: [phone]      CC: Lois Huxley, [place] [address] Phone: [phone]  Fax: [phone]   Return to Endocrinology clinic as below: Future Appointments  Date Time Provider [place]  08/14/2019  1:30 PM [facility] DEXA 1 [facility]  01/17/2020  1:00 PM Kilan Banfill, Melanie Crazier, MD [facility] None

## 2019-07-18 LAB — PARATHYROID HORMONE, INTACT (NO CA): PTH: 15 pg/mL (ref 14–64)

## 2019-07-19 ENCOUNTER — Telehealth: Payer: Self-pay | Admitting: Internal Medicine

## 2019-07-19 DIAGNOSIS — E673 Hypervitaminosis D: Secondary | ICD-10-CM

## 2019-07-19 MED ORDER — METFORMIN HCL ER 500 MG PO TB24: 500.0000 mg | ORAL_TABLET | Freq: Two times a day (BID) | ORAL | 3 refills | Status: DC

## 2019-07-19 NOTE — Telephone Encounter (Signed)
Please let the pt know that her vitamin D has not changed at all , still extremely high.    Please advise her again to COMPLETELY stop all tanning. Reducing it is not enough.   Also let her know that her kidney function is worse then it has been, needs to reduce metformin to twice daily instead of three times daily.   I am not sure of the cause of her low kidney function at this time, it could be as simple as dehydration vs something else.    PLease schedule her for repeat labs in 2 months     Thanks   Abby Raelyn Mora, MD  [facility] Endocrin[facility]  [facility] [address] [place], [place] [address] Phone: [phone] FAX: [phone]

## 2019-07-19 NOTE — Telephone Encounter (Signed)
Lft vm to return call to discuss results.  

## 2019-07-21 ENCOUNTER — Encounter: Payer: Self-pay | Admitting: Internal Medicine

## 2019-07-21 DIAGNOSIS — M8580 Other specified disorders of bone density and structure, unspecified site: Secondary | ICD-10-CM | POA: Insufficient documentation

## 2019-07-21 NOTE — Telephone Encounter (Signed)
Lft 2nd vm to return call to discuss results

## 2019-07-21 NOTE — Telephone Encounter (Signed)
Spoke to pt and she stated that she had been attempting to call the office but was constantly placed on hold for long periods of time, I encouraged pt to sign up for MyChart. Pt now aware of results and changes.

## 2019-08-07 DIAGNOSIS — F3342 Major depressive disorder, recurrent, in full remission: Secondary | ICD-10-CM | POA: Diagnosis not present

## 2019-08-10 DIAGNOSIS — M25562 Pain in left knee: Secondary | ICD-10-CM | POA: Diagnosis not present

## 2019-08-10 DIAGNOSIS — G43009 Migraine without aura, not intractable, without status migrainosus: Secondary | ICD-10-CM | POA: Diagnosis not present

## 2019-08-10 DIAGNOSIS — G894 Chronic pain syndrome: Secondary | ICD-10-CM | POA: Diagnosis not present

## 2019-08-10 DIAGNOSIS — M47812 Spondylosis without myelopathy or radiculopathy, cervical region: Secondary | ICD-10-CM | POA: Diagnosis not present

## 2019-08-14 ENCOUNTER — Ambulatory Visit (INDEPENDENT_AMBULATORY_CARE_PROVIDER_SITE_OTHER)
Admission: RE | Admit: 2019-08-14 | Discharge: 2019-08-14 | Disposition: A | Discharge status: Home / Self Care | Payer: BLUE CROSS/BLUE SHIELD | Source: Ambulatory Visit | Attending: Internal Medicine | Admitting: Internal Medicine

## 2019-08-14 ENCOUNTER — Other Ambulatory Visit: Payer: Self-pay

## 2019-08-14 DIAGNOSIS — M8580 Other specified disorders of bone density and structure, unspecified site: Secondary | ICD-10-CM

## 2019-08-17 DIAGNOSIS — M8580 Other specified disorders of bone density and structure, unspecified site: Secondary | ICD-10-CM | POA: Diagnosis not present

## 2019-08-21 ENCOUNTER — Encounter: Payer: Self-pay | Admitting: Internal Medicine

## 2019-08-23 DIAGNOSIS — L814 Other melanin hyperpigmentation: Secondary | ICD-10-CM | POA: Diagnosis not present

## 2019-08-23 DIAGNOSIS — D485 Neoplasm of uncertain behavior of skin: Secondary | ICD-10-CM | POA: Diagnosis not present

## 2019-08-23 DIAGNOSIS — L570 Actinic keratosis: Secondary | ICD-10-CM | POA: Diagnosis not present

## 2019-08-23 DIAGNOSIS — Z85828 Personal history of other malignant neoplasm of skin: Secondary | ICD-10-CM | POA: Diagnosis not present

## 2019-08-23 DIAGNOSIS — C44619 Basal cell carcinoma of skin of left upper limb, including shoulder: Secondary | ICD-10-CM | POA: Diagnosis not present

## 2019-09-07 DIAGNOSIS — M25562 Pain in left knee: Secondary | ICD-10-CM | POA: Diagnosis not present

## 2019-09-07 DIAGNOSIS — M47812 Spondylosis without myelopathy or radiculopathy, cervical region: Secondary | ICD-10-CM | POA: Diagnosis not present

## 2019-09-07 DIAGNOSIS — G894 Chronic pain syndrome: Secondary | ICD-10-CM | POA: Diagnosis not present

## 2019-09-07 DIAGNOSIS — G43009 Migraine without aura, not intractable, without status migrainosus: Secondary | ICD-10-CM | POA: Diagnosis not present

## 2019-09-18 ENCOUNTER — Encounter: Payer: Self-pay | Admitting: Internal Medicine

## 2019-09-18 ENCOUNTER — Other Ambulatory Visit: Payer: Self-pay

## 2019-09-18 ENCOUNTER — Telehealth: Payer: Self-pay

## 2019-09-18 ENCOUNTER — Ambulatory Visit: Payer: BC Managed Care – PPO | Admitting: Internal Medicine

## 2019-09-18 VITALS — BP 124/82 | HR 120 | Temp 98.6°F | Ht 62.0 in | Wt 106.2 lb

## 2019-09-18 DIAGNOSIS — E282 Polycystic ovarian syndrome: Secondary | ICD-10-CM

## 2019-09-18 DIAGNOSIS — M8580 Other specified disorders of bone density and structure, unspecified site: Secondary | ICD-10-CM | POA: Diagnosis not present

## 2019-09-18 DIAGNOSIS — E673 Hypervitaminosis D: Secondary | ICD-10-CM

## 2019-09-18 DIAGNOSIS — M859 Disorder of bone density and structure, unspecified: Secondary | ICD-10-CM

## 2019-09-18 LAB — LIPID PANEL
Cholesterol: 235 mg/dL — ABNORMAL HIGH (ref 0–200)
HDL: 75.5 mg/dL (ref >39.00)
NonHDL: 159.12
Total CHOL/HDL Ratio: 3
Triglycerides: 206 mg/dL — ABNORMAL HIGH (ref 0.0–149.0)
VLDL: 41.2 mg/dL — ABNORMAL HIGH (ref 0.0–40.0)

## 2019-09-18 LAB — COMPREHENSIVE METABOLIC PANEL
ALT: 16 U/L (ref 0–35)
AST: 18 U/L (ref 0–37)
Albumin: 4.5 g/dL (ref 3.5–5.2)
Alkaline Phosphatase: 49 U/L (ref 39–117)
BUN: 15 mg/dL (ref 6–23)
CO2: 26 mEq/L (ref 19–32)
Calcium: 10.2 mg/dL (ref 8.4–10.5)
Chloride: 102 mEq/L (ref 96–112)
Creatinine, Ser: 0.96 mg/dL (ref 0.40–1.20)
GFR: 60.28 mL/min (ref >60.00)
Glucose, Bld: 97 mg/dL (ref 70–99)
Potassium: 4.8 mEq/L (ref 3.5–5.1)
Sodium: 134 mEq/L — ABNORMAL LOW (ref 135–145)
Total Bilirubin: 0.2 mg/dL (ref 0.2–1.2)
Total Protein: 7.3 g/dL (ref 6.0–8.3)

## 2019-09-18 LAB — GAMMA GT: GGT: 28 U/L (ref 7–51)

## 2019-09-18 LAB — LDL CHOLESTEROL, DIRECT: Direct LDL: 137 mg/dL

## 2019-09-18 LAB — VITAMIN D 25 HYDROXY (VIT D DEFICIENCY, FRACTURES): VITD: >120 ng/mL

## 2019-09-18 MED ORDER — METFORMIN HCL ER 500 MG PO TB24: 500.0000 mg | ORAL_TABLET | Freq: Three times a day (TID) | ORAL | 3 refills | Status: DC

## 2019-09-18 NOTE — Telephone Encounter (Signed)
Main lab called and reported critical Vitamin D level noted to be greater than 120.

## 2019-09-18 NOTE — Progress Notes (Signed)
Name: Shelly Myers  MRN/ DOB: [id number], 1965-10-05    Age/ Sex: 54 y.o., female     PCP: Wilfrid Lund, PA   Reason for Endocrinology Evaluation: Hypervitaminosis D     Initial Endocrinology Clinic Visit: 04/17/2019    PATIENT IDENTIFIER: [name] is a 54 y.o., female with a past medical history of Hx of PCOS, migraine headaches and insomnia. She has followed with [facility] since 04/17/2019 for consultative assistance with management of her hypervitaminosis.   HISTORICAL SUMMARY:  Pt has been referred by Dr. Dion Body for elevated Vitamin D levels > 120 ng/dL . This was attributed to excessive tanning.     Pt has been diagnosed with PCOS in [place] , was following with Endo there. Has been on Metformin for years.  Has 1 child without difficulty conceiving   DXA 08/14/2019 low bone density - T score -1.1 at the right femoral  neck .Mother with osteopenia / osteoporosis but no hip fracture  Menarche at age 60 . Was on OCP's until the age 41 yrs.     SUBJECTIVE:   During last visit (07/17/2019): She was advised to completely stop tanning.   Today (09/18/2019):  Ms. Hirsch is here for a follow up on hypervitaminosis D. She continues to tan less frequently though on average every 2-3 weeks, last one was a week and half a go    She has right sacroiliac joint pain Denies frequency of urination , has improved her water intake.  Denies carpopedal spasms -   Has chronic constipation due to opioid use   She has not been on calcium since stopping the vitamin D supplements.      ROS:  As per HPI.   HISTORY:  Past Medical History:  Past Medical History:  Diagnosis Date  . Allergy   . Arthritis   . DDD (degenerative disc disease), cervical   . Osteopenia   . PCOS (polycystic ovarian syndrome)     Past Surgical History:   Social History:  reports that she has never smoked. She has never used smokeless tobacco. No history on file for  alcohol and drug. Family History:  Family History  Problem Relation Age of Onset  . Hypertension Mother   . Osteopenia Mother   . Diabetes Father   . Skin cancer Father   . Prostate cancer Father   . Esophageal cancer Father   . Hypertension Father   . Hyperlipidemia Father      HOME MEDICATIONS: Allergies as of 09/18/2019   No Known Allergies     Medication List       Accurate as of September 18, 2019  4:02 PM. If you have any questions, ask your nurse or doctor.        Aimovig 140 MG/ML Soaj Generic drug: Erenumab-aooe Inject into the skin every 30 (thirty) days.   ALPRAZolam 1 MG tablet Commonly known as: XANAX Take 2 mg by mouth at bedtime.   clindamycin 1 % external solution Commonly known as: CLEOCIN T APPLY TWICE A DAY EXTERNALLY 30 DAYS   cyclobenzaprine 10 MG tablet Commonly known as: FLEXERIL TAKE 1/2 TABLET BY MOUTH EVERY MORNING AND AT NOON, AND TAKE 1 TABLET EVERY DAY AT BEDTIME   EPINEPHrine 0.3 mg/0.3 mL Soaj injection Commonly known as: EPI-PEN INJECT AS DIRECTED FOR LIFE THREATENING REACTION   fluticasone 50 MCG/ACT nasal spray Commonly known as: FLONASE Place 2 sprays into both nostrils as needed.   meloxicam 15 MG tablet Commonly  known as: MOBIC Take 15 mg by mouth daily.   metFORMIN 500 MG 24 hr tablet Commonly known as: GLUCOPHAGE-XR Take 1 tablet (500 mg total) by mouth 3 (three) times daily. What changed: when to take this Changed by: Scarlette Shorts, MD   Nurtec 75 MG Tbdp Generic drug: Rimegepant Sulfate Take 1 tablet by mouth daily as needed.   nystatin-triamcinolone ointment Commonly known as: MYCOLOG APPLY TOPICALLY TO THE AFFECTED AREA 2 TIMES A DAY.   oxyCODONE-acetaminophen 10-325 MG tablet Commonly known as: PERCOCET Take 1 tablet by mouth 2 (two) times daily as needed.   Prempro 0.45-1.5 MG tablet Generic drug: estrogen (conjugated)-medroxyprogesterone Take 1 tablet by mouth daily.   traZODone 150 MG  tablet Commonly known as: DESYREL Take 300 mg by mouth at bedtime.   triamcinolone cream 0.5 % Commonly known as: KENALOG APPLY TO AFFECTED AREA TWICE DAILY AS NEEDED   Trintellix 20 MG Tabs tablet Generic drug: vortioxetine HBr Take 20 mg by mouth daily.   Xtampza ER 18 MG C12a Generic drug: oxyCODONE ER Take 1 capsule by mouth every 12 (twelve) hours.         OBJECTIVE:   PHYSICAL EXAM: VS: BP 124/82 (BP Location: Right Arm, Patient Position: Sitting, Cuff Size: Normal)   Pulse (!) 120   Temp 98.6 F (37 C)   Ht 5\' 2"  (1.575 m)   Wt 106 lb 3.2 oz (48.2 kg)   SpO2 98%   BMI 19.42 kg/m    EXAM: General: Pt appears well and is in NAD  Neck: General: Supple without adenopathy. Thyroid: Thyroid size normal.  No goiter or nodules appreciated. No thyroid bruit.  Lungs: Clear with good BS bilat with no rales, rhonchi, or wheezes  Heart: Auscultation: RRR.  Abdomen: Normoactive bowel sounds, soft, nontender, without masses or organomegaly palpable  Extremities:  BL LE: No pretibial edema normal ROM and strength.  Skin: Evidence of tanning noted more over the abdomen. Less over the face this time.   Mental Status: Judgment, insight: Intact Orientation: Oriented to time, place, and person Mood and affect: No depression, anxiety, or agitation     DATA REVIEWED:  Results for MAZIYAH, VESSEL (MRN Gardiner Barefoot) as of 09/20/2019 07:42  Ref. Range 09/18/2019 14:30  Sodium Latest Ref Range: 135 - 145 mEq/L 134 (L)  Potassium Latest Ref Range: 3.5 - 5.1 mEq/L 4.8  Chloride Latest Ref Range: 96 - 112 mEq/L 102  CO2 Latest Ref Range: 19 - 32 mEq/L 26  Glucose Latest Ref Range: 70 - 99 mg/dL 97  BUN Latest Ref Range: 6 - 23 mg/dL 15  Creatinine Latest Ref Range: 0.40 - 1.20 mg/dL 09/20/2019  Calcium Latest Ref Range: 8.4 - 10.5 mg/dL 6.57  Alkaline Phosphatase Latest Ref Range: 39 - 117 U/L 49  Albumin Latest Ref Range: 3.5 - 5.2 g/dL 4.5  AST Latest Ref Range: 0 - 37 U/L 18  ALT  Latest Ref Range: 0 - 35 U/L 16  Total Protein Latest Ref Range: 6.0 - 8.3 g/dL 7.3  Total Bilirubin Latest Ref Range: 0.2 - 1.2 mg/dL 0.2  GGT Latest Ref Range: 7 - 51 U/L 28  GFR Latest Ref Range: >60.00 mL/min 60.28  Total CHOL/HDL Ratio Unknown 3  Cholesterol Latest Ref Range: 0 - 200 mg/dL 84.6 (H)  HDL Cholesterol Latest Ref Range: >39.00 mg/dL 962  Direct LDL Latest Units: mg/dL 95.28  NonHDL Unknown 413.2  Triglycerides Latest Ref Range: 0.0 - 149.0 mg/dL 440.10 (H)  VLDL Latest  Ref Range: 0.0 - 40.0 mg/dL 41.2 (H)  VITD Latest Units: ng/mL >120   ASSESSMENT / PLAN / RECOMMENDATIONS:   1. Hypervitaminsosis D:    -  Natural foods contain very little vitamin D, the major source of vitamin D is dermal synthesis. The literature indicates that sun exposure does not cause toxic levels of Vitamin D3 (usually less then 80 ng/dL) , but I suspect tanning beds could play and additional role in this.   - Symptoms of intoxication present due to hypercalcemia, nephrolithiasis and bone demineralization, which this has been discussed with the pt. - She assures me she is not on any oral vitamin D or calcium supplementation.  - She tells me she stopped using the tanning beds ~ 1 months ago, but there's evidence of heavy tanning on the abdominal area, pt states she started using a tanning lotion.     2.  Low bone density:   -DXA in 07/2019 showed slight low bone density . She was advised to consume 2-3 servings of calcium in her food and continue to avoid Vitamin D.    3. Low GFR :  - Resolved  4. PCOS :   - Tolerating Metformin TID    Follow-up in 6 months   Signed electronically by: Mack Guise, MD  [facility]  [facility] [address] [place], [place] [address] Phone: [phone] FAX: [phone]      CC: Lois Huxley, [place] [address] Phone: [phone]  Fax: [phone]   Return  to Endocrinology clinic as below: Future Appointments  Date Time Provider Fl[place]  01/17/2020  1:00 PM Annabelle Rexroad, Melanie Crazier, MD [facility] None

## 2019-09-18 NOTE — Patient Instructions (Signed)
-   Please do not start collecting the urine , unless you hear from [place]   - 24-Hour Urine Collection   You will be collecting your urine for a 24-hour period of time.  Your timer starts with your first urine of the morning (For example - If you first pee at 9AM, your timer will start at 9AM)  Throw away your first urine of the morning  Collect your urine every time you pee for the next 24 hours STOP your urine collection 24 hours after you started the collection (For example - You would stop at 9AM the day after you started)

## 2019-09-19 ENCOUNTER — Telehealth: Payer: Self-pay | Admitting: Internal Medicine

## 2019-09-19 NOTE — Telephone Encounter (Signed)
Please let her know that her kidney function has normalized, her Vitamin D is still very high  And has not changed. Vitamin D is not going to improve unless tanning stops completely for a minimum of 3 months    Cholesterol looks good as well     Thanks   Abby Raelyn Mora, MD  [facility]  [facility] [address] [place], [place] [address] Phone: [phone] FAX: [phone]

## 2019-09-19 NOTE — Telephone Encounter (Signed)
Pt aware of results 

## 2019-09-20 DIAGNOSIS — M859 Disorder of bone density and structure, unspecified: Secondary | ICD-10-CM | POA: Insufficient documentation

## 2019-09-20 DIAGNOSIS — M8580 Other specified disorders of bone density and structure, unspecified site: Secondary | ICD-10-CM | POA: Insufficient documentation

## 2019-09-21 DIAGNOSIS — C44619 Basal cell carcinoma of skin of left upper limb, including shoulder: Secondary | ICD-10-CM | POA: Diagnosis not present

## 2019-09-21 LAB — VITAMIN D 1,25 DIHYDROXY
Vitamin D 1, 25 (OH)2 Total: 76 pg/mL — ABNORMAL HIGH (ref 18–72)
Vitamin D2 1, 25 (OH)2: <8 pg/mL
Vitamin D3 1, 25 (OH)2: 76 pg/mL

## 2019-09-29 DIAGNOSIS — L570 Actinic keratosis: Secondary | ICD-10-CM | POA: Diagnosis not present

## 2019-10-05 DIAGNOSIS — G43009 Migraine without aura, not intractable, without status migrainosus: Secondary | ICD-10-CM | POA: Diagnosis not present

## 2019-10-05 DIAGNOSIS — M47812 Spondylosis without myelopathy or radiculopathy, cervical region: Secondary | ICD-10-CM | POA: Diagnosis not present

## 2019-10-05 DIAGNOSIS — M25562 Pain in left knee: Secondary | ICD-10-CM | POA: Diagnosis not present

## 2019-10-05 DIAGNOSIS — G894 Chronic pain syndrome: Secondary | ICD-10-CM | POA: Diagnosis not present

## 2019-10-12 DIAGNOSIS — K5903 Drug induced constipation: Secondary | ICD-10-CM | POA: Diagnosis not present

## 2019-10-12 DIAGNOSIS — Z0000 Encounter for general adult medical examination without abnormal findings: Secondary | ICD-10-CM | POA: Diagnosis not present

## 2019-10-20 DIAGNOSIS — L570 Actinic keratosis: Secondary | ICD-10-CM | POA: Diagnosis not present

## 2019-10-26 DIAGNOSIS — N951 Menopausal and female climacteric states: Secondary | ICD-10-CM | POA: Diagnosis not present

## 2019-11-02 DIAGNOSIS — G894 Chronic pain syndrome: Secondary | ICD-10-CM | POA: Diagnosis not present

## 2019-11-02 DIAGNOSIS — G43009 Migraine without aura, not intractable, without status migrainosus: Secondary | ICD-10-CM | POA: Diagnosis not present

## 2019-11-02 DIAGNOSIS — M47812 Spondylosis without myelopathy or radiculopathy, cervical region: Secondary | ICD-10-CM | POA: Diagnosis not present

## 2019-11-02 DIAGNOSIS — M25562 Pain in left knee: Secondary | ICD-10-CM | POA: Diagnosis not present

## 2019-11-06 ENCOUNTER — Other Ambulatory Visit: Payer: Self-pay | Admitting: Physical Medicine and Rehabilitation

## 2019-11-06 ENCOUNTER — Other Ambulatory Visit: Payer: Self-pay

## 2019-11-06 ENCOUNTER — Ambulatory Visit
Admission: RE | Admit: 2019-11-06 | Discharge: 2019-11-06 | Disposition: A | Discharge status: Home / Self Care | Payer: BC Managed Care – PPO | Source: Ambulatory Visit | Attending: Physical Medicine and Rehabilitation | Admitting: Physical Medicine and Rehabilitation

## 2019-11-06 DIAGNOSIS — M5440 Lumbago with sciatica, unspecified side: Secondary | ICD-10-CM

## 2019-11-06 DIAGNOSIS — M545 Low back pain: Secondary | ICD-10-CM | POA: Diagnosis not present

## 2019-11-14 DIAGNOSIS — M47816 Spondylosis without myelopathy or radiculopathy, lumbar region: Secondary | ICD-10-CM | POA: Diagnosis not present

## 2019-11-27 DIAGNOSIS — C44519 Basal cell carcinoma of skin of other part of trunk: Secondary | ICD-10-CM | POA: Diagnosis not present

## 2019-11-27 DIAGNOSIS — L570 Actinic keratosis: Secondary | ICD-10-CM | POA: Diagnosis not present

## 2019-11-27 DIAGNOSIS — D485 Neoplasm of uncertain behavior of skin: Secondary | ICD-10-CM | POA: Diagnosis not present

## 2019-11-29 DIAGNOSIS — G43009 Migraine without aura, not intractable, without status migrainosus: Secondary | ICD-10-CM | POA: Diagnosis not present

## 2019-11-29 DIAGNOSIS — M47812 Spondylosis without myelopathy or radiculopathy, cervical region: Secondary | ICD-10-CM | POA: Diagnosis not present

## 2019-11-29 DIAGNOSIS — Z79891 Long term (current) use of opiate analgesic: Secondary | ICD-10-CM | POA: Diagnosis not present

## 2019-11-29 DIAGNOSIS — M25562 Pain in left knee: Secondary | ICD-10-CM | POA: Diagnosis not present

## 2019-11-29 DIAGNOSIS — G894 Chronic pain syndrome: Secondary | ICD-10-CM | POA: Diagnosis not present

## 2019-12-18 DIAGNOSIS — C44519 Basal cell carcinoma of skin of other part of trunk: Secondary | ICD-10-CM | POA: Diagnosis not present

## 2019-12-21 DIAGNOSIS — H6983 Other specified disorders of Eustachian tube, bilateral: Secondary | ICD-10-CM | POA: Diagnosis not present

## 2019-12-27 DIAGNOSIS — G894 Chronic pain syndrome: Secondary | ICD-10-CM | POA: Diagnosis not present

## 2019-12-27 DIAGNOSIS — M25562 Pain in left knee: Secondary | ICD-10-CM | POA: Diagnosis not present

## 2019-12-27 DIAGNOSIS — G43009 Migraine without aura, not intractable, without status migrainosus: Secondary | ICD-10-CM | POA: Diagnosis not present

## 2019-12-27 DIAGNOSIS — M47812 Spondylosis without myelopathy or radiculopathy, cervical region: Secondary | ICD-10-CM | POA: Diagnosis not present

## 2020-01-08 DIAGNOSIS — F3342 Major depressive disorder, recurrent, in full remission: Secondary | ICD-10-CM | POA: Diagnosis not present

## 2020-01-17 ENCOUNTER — Ambulatory Visit: Payer: Self-pay | Admitting: Internal Medicine

## 2020-01-22 ENCOUNTER — Ambulatory Visit: Payer: BC Managed Care – PPO | Admitting: Internal Medicine

## 2020-01-22 ENCOUNTER — Other Ambulatory Visit: Payer: Self-pay

## 2020-01-22 ENCOUNTER — Encounter: Payer: Self-pay | Admitting: Internal Medicine

## 2020-01-22 VITALS — BP 124/80 | HR 102 | Ht 62.0 in | Wt 102.2 lb

## 2020-01-22 DIAGNOSIS — M859 Disorder of bone density and structure, unspecified: Secondary | ICD-10-CM

## 2020-01-22 DIAGNOSIS — E282 Polycystic ovarian syndrome: Secondary | ICD-10-CM | POA: Diagnosis not present

## 2020-01-22 DIAGNOSIS — E673 Hypervitaminosis D: Secondary | ICD-10-CM

## 2020-01-22 DIAGNOSIS — M8580 Other specified disorders of bone density and structure, unspecified site: Secondary | ICD-10-CM | POA: Diagnosis not present

## 2020-01-22 MED ORDER — METFORMIN HCL ER 500 MG PO TB24: 500.0000 mg | ORAL_TABLET | Freq: Three times a day (TID) | ORAL | 3 refills | Status: DC

## 2020-01-22 NOTE — Progress Notes (Signed)
Name: Shelly Myers  MRN/ DOB: [id number], 1965/07/25    Age/ Sex: 54 y.o., female     PCP: Shelly Lund, PA   Reason for Endocrinology Evaluation: Hypervitaminosis D     Initial Endocrinology Clinic Visit: 04/17/2019    PATIENT IDENTIFIER: [name] is a 54 y.o., female with a past medical history of Hx of PCOS, migraine headaches and insomnia. She has followed with [facility] since 04/17/2019 for consultative assistance with management of her hypervitaminosis.   HISTORICAL SUMMARY:  Pt has been referred by Dr. Dion Myers for elevated Vitamin D levels > 120 ng/dL . This was attributed to excessive tanning.    Pt has been diagnosed with PCOS in [place] , was following with Endo there. Has been on Metformin for years.  Has 1 child without difficulty conceiving   DXA 08/14/2019 low bone density - T score -1.1 at the right femoral  neck .Mother with osteopenia / osteoporosis but no hip fracture  Menarche at age 54 . Was on OCP's until the age 64 yrs.     SUBJECTIVE:   During last visit (07/17/2019): She was advised to completely stop tanning.   Today (01/22/2020):  Shelly Myers is here for a follow up on hypervitaminosis D. She stopped tanning but uses tanning  lotion.    Has chronic constipation due to opioid use Denies polyuria and polydipsia  Denies renal stones.   Metformin 500 mg, TID    ROS:  As per HPI.   HISTORY:  Past Medical History:  Past Medical History:  Diagnosis Date  . Allergy   . Arthritis   . DDD (degenerative disc disease), cervical   . Osteopenia   . PCOS (polycystic ovarian syndrome)     Past Surgical History:   Social History:  reports that she has never smoked. She has never used smokeless tobacco. No history on file for alcohol use and drug use. Family History:  Family History  Problem Relation Age of Onset  . Hypertension Mother   . Osteopenia Mother   . Diabetes Father   . Skin cancer Father   . Prostate  cancer Father   . Esophageal cancer Father   . Hypertension Father   . Hyperlipidemia Father      HOME MEDICATIONS: Allergies as of 01/22/2020   No Known Allergies     Medication List       Accurate as of January 22, 2020 12:57 PM. If you have any questions, ask your nurse or doctor.        Aimovig 140 MG/ML Soaj Generic drug: Erenumab-aooe Inject into the skin every 30 (thirty) days.   ALPRAZolam 1 MG tablet Commonly known as: XANAX Take 2 mg by mouth at bedtime.   clindamycin 1 % external solution Commonly known as: CLEOCIN T APPLY TWICE A DAY EXTERNALLY 30 DAYS   cyclobenzaprine 10 MG tablet Commonly known as: FLEXERIL TAKE 1/2 TABLET BY MOUTH EVERY MORNING AND AT NOON, AND TAKE 1 TABLET EVERY DAY AT BEDTIME   EPINEPHrine 0.3 mg/0.3 mL Soaj injection Commonly known as: EPI-PEN INJECT AS DIRECTED FOR LIFE THREATENING REACTION   fluticasone 50 MCG/ACT nasal spray Commonly known as: FLONASE Place 2 sprays into both nostrils as needed.   meloxicam 15 MG tablet Commonly known as: MOBIC Take 15 mg by mouth daily.   metFORMIN 500 MG 24 hr tablet Commonly known as: GLUCOPHAGE-XR Take 1 tablet (500 mg total) by mouth 3 (three) times daily.   Nurtec 75 MG  Tbdp Generic drug: Rimegepant Sulfate Take 1 tablet by mouth daily as needed.   nystatin-triamcinolone ointment Commonly known as: MYCOLOG APPLY TOPICALLY TO THE AFFECTED AREA 2 TIMES A DAY.   oxyCODONE-acetaminophen 10-325 MG tablet Commonly known as: PERCOCET Take 1 tablet by mouth 2 (two) times daily as needed.   Prempro 0.45-1.5 MG tablet Generic drug: estrogen (conjugated)-medroxyprogesterone Take 1 tablet by mouth daily.   traZODone 150 MG tablet Commonly known as: DESYREL Take 300 mg by mouth at bedtime.   triamcinolone cream 0.5 % Commonly known as: KENALOG APPLY TO AFFECTED AREA TWICE DAILY AS NEEDED   Trintellix 20 MG Tabs tablet Generic drug: vortioxetine HBr Take 20 mg by mouth daily.    Xtampza ER 18 MG C12a Generic drug: oxyCODONE ER Take 1 capsule by mouth every 12 (twelve) hours.         OBJECTIVE:   PHYSICAL EXAM: VS: BP 124/80 (BP Location: Left Arm, Patient Position: Sitting, Cuff Size: Normal)   Pulse (!) 102   Ht 5\' 2"  (1.575 m)   Wt 102 lb 3.2 oz (46.4 kg)   SpO2 98%   BMI 18.69 kg/m    EXAM: General: Pt appears well and is in NAD  Neck: General: Supple without adenopathy. Thyroid: Thyroid size normal.  No goiter or nodules appreciated. No thyroid bruit.  Lungs: Clear with good BS bilat with no rales, rhonchi, or wheezes  Heart: Auscultation: RRR.  Abdomen: Normoactive bowel sounds, soft, nontender, without masses or organomegaly palpable  Extremities:  BL LE: No pretibial edema normal ROM and strength.  Skin: Evidence of tanning noted more over the abdomen. Less over the face and hands  Mental Status: Judgment, insight: Intact Orientation: Oriented to time, place, and person Mood and affect: No depression, anxiety, or agitation     DATA REVIEWED:  Results for Shelly, Myers (MRN Shelly Myers) as of 01/23/2020 08:27  Ref. Range 01/22/2020 15:03  Sodium Latest Ref Range: 135 - 146 mmol/L 137  Potassium Latest Ref Range: 3.5 - 5.3 mmol/L 4.6  Chloride Latest Ref Range: 98 - 110 mmol/L 100  CO2 Latest Ref Range: 20 - 32 mmol/L 29  Glucose Latest Ref Range: 65 - 99 mg/dL 81  BUN Latest Ref Range: 7 - 25 mg/dL 16  Creatinine Latest Ref Range: 0.50 - 1.05 mg/dL 01/24/2020  Calcium Latest Ref Range: 8.6 - 10.4 mg/dL 9.9  BUN/Creatinine Ratio Latest Ref Range: 6 - 22 (calc) NOT APPLICABLE  Vitamin D, 25-Hydroxy Latest Ref Range: 30 - 100 ng/mL 74     ASSESSMENT / PLAN / RECOMMENDATIONS:   1. Hypervitaminsosis D:    -  Natural foods contain very little vitamin D, the major source of vitamin D is dermal synthesis. The literature indicates that sun exposure does not cause toxic levels of Vitamin D3 (usually less then 80 ng/dL) , but I suspect tanning  beds ha ve played a role. Her Vitamin D level has NORMALIZED. Pt to continue avoiding tanning beds but may continue to use tanning lotion.   - Symptoms of intoxication present due to hypercalcemia, nephrolithiasis and bone demineralization, which this has been discussed with the pt in the past      2.  Low bone density:   -DXA in 07/2019 showed slight low bone density . She was advised to consume 2-3 servings of calcium in her food and continue to avoid Vitamin D.      3. PCOS :   - Tolerating Metformin TID    Follow-up in  6 months   Signed electronically by: Lyndle Herrlich, MD  [facility]  [facility] [address] [place] [place], [place] [address] Phone: [phone] FAX: [phone]      CC: [place] [address] [place] [address] Phone: [phone]  Fax: [phone]   Return to Endocrinology clinic as below: Future Appointments  Date Time Provider Department Center  01/22/2020  2:40 PM Pratham Cassatt, Konrad Dolores, MD [facility] None

## 2020-01-23 ENCOUNTER — Encounter: Payer: Self-pay | Admitting: Internal Medicine

## 2020-01-23 DIAGNOSIS — M8580 Other specified disorders of bone density and structure, unspecified site: Secondary | ICD-10-CM

## 2020-01-23 DIAGNOSIS — M859 Disorder of bone density and structure, unspecified: Secondary | ICD-10-CM

## 2020-01-23 DIAGNOSIS — E282 Polycystic ovarian syndrome: Secondary | ICD-10-CM

## 2020-01-23 DIAGNOSIS — E673 Hypervitaminosis D: Secondary | ICD-10-CM

## 2020-01-23 LAB — BASIC METABOLIC PANEL WITH GFR
BUN: 16 mg/dL (ref 7–25)
CO2: 29 mmol/L (ref 20–32)
Calcium: 9.9 mg/dL (ref 8.6–10.4)
Chloride: 100 mmol/L (ref 98–110)
Creat: 0.91 mg/dL (ref 0.50–1.05)
Glucose, Bld: 81 mg/dL (ref 65–99)
Potassium: 4.6 mmol/L (ref 3.5–5.3)
Sodium: 137 mmol/L (ref 135–146)

## 2020-01-23 LAB — VITAMIN D 25 HYDROXY (VIT D DEFICIENCY, FRACTURES): Vit D, 25-Hydroxy: 74 ng/mL (ref 30–100)

## 2020-01-23 MED ORDER — METFORMIN HCL ER 500 MG PO TB24: 500.0000 mg | ORAL_TABLET | Freq: Three times a day (TID) | ORAL | 3 refills | Status: DC

## 2020-01-25 DIAGNOSIS — M47812 Spondylosis without myelopathy or radiculopathy, cervical region: Secondary | ICD-10-CM | POA: Diagnosis not present

## 2020-01-25 DIAGNOSIS — G894 Chronic pain syndrome: Secondary | ICD-10-CM | POA: Diagnosis not present

## 2020-01-25 DIAGNOSIS — M25562 Pain in left knee: Secondary | ICD-10-CM | POA: Diagnosis not present

## 2020-01-25 DIAGNOSIS — G43009 Migraine without aura, not intractable, without status migrainosus: Secondary | ICD-10-CM | POA: Diagnosis not present

## 2020-01-29 DIAGNOSIS — H93293 Other abnormal auditory perceptions, bilateral: Secondary | ICD-10-CM | POA: Diagnosis not present

## 2020-01-29 DIAGNOSIS — H6983 Other specified disorders of Eustachian tube, bilateral: Secondary | ICD-10-CM | POA: Diagnosis not present

## 2020-02-13 DIAGNOSIS — N950 Postmenopausal bleeding: Secondary | ICD-10-CM | POA: Diagnosis not present

## 2020-02-13 DIAGNOSIS — Z0000 Encounter for general adult medical examination without abnormal findings: Secondary | ICD-10-CM | POA: Diagnosis not present

## 2020-02-22 ENCOUNTER — Other Ambulatory Visit: Payer: Self-pay | Admitting: Physician Assistant

## 2020-02-22 DIAGNOSIS — M542 Cervicalgia: Secondary | ICD-10-CM | POA: Diagnosis not present

## 2020-02-23 ENCOUNTER — Ambulatory Visit: Payer: BC Managed Care – PPO

## 2020-02-23 ENCOUNTER — Other Ambulatory Visit: Payer: Self-pay | Admitting: Physician Assistant

## 2020-02-23 ENCOUNTER — Other Ambulatory Visit: Payer: BC Managed Care – PPO

## 2020-02-23 DIAGNOSIS — M542 Cervicalgia: Secondary | ICD-10-CM

## 2020-02-27 DIAGNOSIS — M25562 Pain in left knee: Secondary | ICD-10-CM | POA: Diagnosis not present

## 2020-02-27 DIAGNOSIS — G894 Chronic pain syndrome: Secondary | ICD-10-CM | POA: Diagnosis not present

## 2020-02-27 DIAGNOSIS — G43009 Migraine without aura, not intractable, without status migrainosus: Secondary | ICD-10-CM | POA: Diagnosis not present

## 2020-02-27 DIAGNOSIS — M47812 Spondylosis without myelopathy or radiculopathy, cervical region: Secondary | ICD-10-CM | POA: Diagnosis not present

## 2020-02-27 DIAGNOSIS — N950 Postmenopausal bleeding: Secondary | ICD-10-CM | POA: Diagnosis not present

## 2020-02-28 ENCOUNTER — Ambulatory Visit
Admission: RE | Admit: 2020-02-28 | Discharge: 2020-02-28 | Disposition: A | Discharge status: Home / Self Care | Payer: BC Managed Care – PPO | Source: Ambulatory Visit | Attending: Physician Assistant | Admitting: Physician Assistant

## 2020-02-28 ENCOUNTER — Other Ambulatory Visit: Payer: Self-pay | Admitting: Physician Assistant

## 2020-02-28 DIAGNOSIS — M5030 Other cervical disc degeneration, unspecified cervical region: Secondary | ICD-10-CM | POA: Diagnosis not present

## 2020-02-28 DIAGNOSIS — M4312 Spondylolisthesis, cervical region: Secondary | ICD-10-CM | POA: Diagnosis not present

## 2020-02-28 DIAGNOSIS — M542 Cervicalgia: Secondary | ICD-10-CM

## 2020-03-06 DIAGNOSIS — M791 Myalgia, unspecified site: Secondary | ICD-10-CM | POA: Diagnosis not present

## 2020-03-13 DIAGNOSIS — L570 Actinic keratosis: Secondary | ICD-10-CM | POA: Diagnosis not present

## 2020-03-20 DIAGNOSIS — G8929 Other chronic pain: Secondary | ICD-10-CM | POA: Diagnosis not present

## 2020-03-20 DIAGNOSIS — R519 Headache, unspecified: Secondary | ICD-10-CM | POA: Diagnosis not present

## 2020-03-20 DIAGNOSIS — M5030 Other cervical disc degeneration, unspecified cervical region: Secondary | ICD-10-CM | POA: Diagnosis not present

## 2020-03-20 DIAGNOSIS — R6884 Jaw pain: Secondary | ICD-10-CM | POA: Diagnosis not present

## 2020-03-21 ENCOUNTER — Other Ambulatory Visit: Payer: Self-pay | Admitting: Family Medicine

## 2020-03-21 DIAGNOSIS — M5030 Other cervical disc degeneration, unspecified cervical region: Secondary | ICD-10-CM

## 2020-03-25 ENCOUNTER — Other Ambulatory Visit: Payer: Self-pay | Admitting: Internal Medicine

## 2020-03-25 DIAGNOSIS — E282 Polycystic ovarian syndrome: Secondary | ICD-10-CM

## 2020-03-26 DIAGNOSIS — G894 Chronic pain syndrome: Secondary | ICD-10-CM | POA: Diagnosis not present

## 2020-03-26 DIAGNOSIS — G43009 Migraine without aura, not intractable, without status migrainosus: Secondary | ICD-10-CM | POA: Diagnosis not present

## 2020-03-26 DIAGNOSIS — M25562 Pain in left knee: Secondary | ICD-10-CM | POA: Diagnosis not present

## 2020-03-26 DIAGNOSIS — M47812 Spondylosis without myelopathy or radiculopathy, cervical region: Secondary | ICD-10-CM | POA: Diagnosis not present

## 2020-03-28 ENCOUNTER — Other Ambulatory Visit: Payer: Self-pay | Admitting: Family Medicine

## 2020-03-28 DIAGNOSIS — R519 Headache, unspecified: Secondary | ICD-10-CM

## 2020-04-12 ENCOUNTER — Inpatient Hospital Stay: Admission: RE | Admit: 2020-04-12 | Payer: BC Managed Care – PPO | Source: Ambulatory Visit

## 2020-04-18 ENCOUNTER — Ambulatory Visit
Admission: RE | Admit: 2020-04-18 | Discharge: 2020-04-18 | Disposition: A | Discharge status: Home / Self Care | Payer: BC Managed Care – PPO | Source: Ambulatory Visit | Attending: Family Medicine | Admitting: Family Medicine

## 2020-04-18 DIAGNOSIS — R519 Headache, unspecified: Secondary | ICD-10-CM

## 2020-04-18 DIAGNOSIS — M4802 Spinal stenosis, cervical region: Secondary | ICD-10-CM | POA: Diagnosis not present

## 2020-04-18 DIAGNOSIS — H9319 Tinnitus, unspecified ear: Secondary | ICD-10-CM | POA: Diagnosis not present

## 2020-04-18 DIAGNOSIS — M5030 Other cervical disc degeneration, unspecified cervical region: Secondary | ICD-10-CM

## 2020-04-18 MED ORDER — GADOBENATE DIMEGLUMINE 529 MG/ML IV SOLN
10.0000 mL | Freq: Once | INTRAVENOUS | Status: AC | PRN
  Administered 2020-04-18: 10 mL via INTRAVENOUS

## 2020-04-24 DIAGNOSIS — G43009 Migraine without aura, not intractable, without status migrainosus: Secondary | ICD-10-CM | POA: Diagnosis not present

## 2020-04-24 DIAGNOSIS — G894 Chronic pain syndrome: Secondary | ICD-10-CM | POA: Diagnosis not present

## 2020-04-24 DIAGNOSIS — M47812 Spondylosis without myelopathy or radiculopathy, cervical region: Secondary | ICD-10-CM | POA: Diagnosis not present

## 2020-04-24 DIAGNOSIS — M25562 Pain in left knee: Secondary | ICD-10-CM | POA: Diagnosis not present

## 2020-05-16 DIAGNOSIS — G894 Chronic pain syndrome: Secondary | ICD-10-CM | POA: Diagnosis not present

## 2020-05-16 DIAGNOSIS — G43009 Migraine without aura, not intractable, without status migrainosus: Secondary | ICD-10-CM | POA: Diagnosis not present

## 2020-05-16 DIAGNOSIS — M25562 Pain in left knee: Secondary | ICD-10-CM | POA: Diagnosis not present

## 2020-05-16 DIAGNOSIS — M47812 Spondylosis without myelopathy or radiculopathy, cervical region: Secondary | ICD-10-CM | POA: Diagnosis not present

## 2020-06-11 DIAGNOSIS — N898 Other specified noninflammatory disorders of vagina: Secondary | ICD-10-CM | POA: Diagnosis not present

## 2020-06-11 DIAGNOSIS — N950 Postmenopausal bleeding: Secondary | ICD-10-CM | POA: Diagnosis not present

## 2020-06-11 DIAGNOSIS — N9089 Other specified noninflammatory disorders of vulva and perineum: Secondary | ICD-10-CM | POA: Diagnosis not present

## 2020-06-13 DIAGNOSIS — G894 Chronic pain syndrome: Secondary | ICD-10-CM | POA: Diagnosis not present

## 2020-06-13 DIAGNOSIS — M47812 Spondylosis without myelopathy or radiculopathy, cervical region: Secondary | ICD-10-CM | POA: Diagnosis not present

## 2020-06-13 DIAGNOSIS — G43009 Migraine without aura, not intractable, without status migrainosus: Secondary | ICD-10-CM | POA: Diagnosis not present

## 2020-06-13 DIAGNOSIS — M25562 Pain in left knee: Secondary | ICD-10-CM | POA: Diagnosis not present

## 2020-07-15 DIAGNOSIS — G894 Chronic pain syndrome: Secondary | ICD-10-CM | POA: Diagnosis not present

## 2020-07-15 DIAGNOSIS — M25562 Pain in left knee: Secondary | ICD-10-CM | POA: Diagnosis not present

## 2020-07-15 DIAGNOSIS — M47812 Spondylosis without myelopathy or radiculopathy, cervical region: Secondary | ICD-10-CM | POA: Diagnosis not present

## 2020-07-15 DIAGNOSIS — G43009 Migraine without aura, not intractable, without status migrainosus: Secondary | ICD-10-CM | POA: Diagnosis not present

## 2020-07-25 DIAGNOSIS — M461 Sacroiliitis, not elsewhere classified: Secondary | ICD-10-CM | POA: Diagnosis not present

## 2020-07-25 DIAGNOSIS — R519 Headache, unspecified: Secondary | ICD-10-CM | POA: Diagnosis not present

## 2020-07-29 ENCOUNTER — Encounter: Payer: Self-pay | Admitting: Internal Medicine

## 2020-07-29 ENCOUNTER — Other Ambulatory Visit: Payer: Self-pay

## 2020-07-29 ENCOUNTER — Ambulatory Visit: Payer: BC Managed Care – PPO | Admitting: Internal Medicine

## 2020-07-29 VITALS — BP 126/76 | HR 109 | Ht 62.0 in | Wt 109.2 lb

## 2020-07-29 DIAGNOSIS — E673 Hypervitaminosis D: Secondary | ICD-10-CM | POA: Diagnosis not present

## 2020-07-29 DIAGNOSIS — E781 Pure hyperglyceridemia: Secondary | ICD-10-CM

## 2020-07-29 DIAGNOSIS — E282 Polycystic ovarian syndrome: Secondary | ICD-10-CM | POA: Diagnosis not present

## 2020-07-29 DIAGNOSIS — M859 Disorder of bone density and structure, unspecified: Secondary | ICD-10-CM

## 2020-07-29 DIAGNOSIS — M8580 Other specified disorders of bone density and structure, unspecified site: Secondary | ICD-10-CM

## 2020-07-29 MED ORDER — METFORMIN HCL ER 500 MG PO TB24: 1500.0000 mg | ORAL_TABLET | Freq: Every day | ORAL | 3 refills | Status: DC

## 2020-07-29 NOTE — Patient Instructions (Signed)
-   Continue  Metformin 500 mg three times daily

## 2020-07-29 NOTE — Progress Notes (Signed)
Name: Shelly Myers  MRN/ DOB: [id number], 06-06-1965    Age/ Sex: 55 y.o., female     PCP: Lois Huxley, PA   Reason for Endocrinology Evaluation: Hypervitaminosis D     Initial Endocrinology Clinic Visit: 04/17/2019    PATIENT IDENTIFIER: Shelly Myers is a 55 y.o., female with a past medical history of Hx of PCOS, migraine headaches and insomnia. She has followed with [facility] since 04/17/2019 for consultative assistance with management of her hypervitaminosis.   HISTORICAL SUMMARY:  Pt has been referred by Dr. Simona Huh for elevated Vitamin D levels > 120 ng/dL . This was attributed to excessive tanning.    Pt has been diagnosed with PCOS in [place] , was following with Endo there. Has been on Metformin for years.  Has 1 child without difficulty conceiving   DXA 08/14/2019 low bone density - T score -1.1 at the right femoral  neck .Mother with osteopenia / osteoporosis but no hip fracture  Menarche at age 55 . Was on OCP's until the age 55 yrs.     SUBJECTIVE:    Today (07/29/2020):  Shelly Myers is here for a follow up on hypervitaminosis D. She stopped tanning but uses tanning  lotion.    Has chronic constipation due to opioid use Denies polyuria and polydipsia  Denies renal stones.  Weight stable   She is on MVI with vitamin D , stopped tanning   Metformin 500 mg, TID    HISTORY:  Past Medical History:  Past Medical History:  Diagnosis Date  . Allergy   . Arthritis   . DDD (degenerative disc disease), cervical   . Osteopenia   . PCOS (polycystic ovarian syndrome)     Past Surgical History:   Social History:  reports that she has never smoked. She has never used smokeless tobacco. No history on file for alcohol use and drug use. Family History:  Family History  Problem Relation Age of Onset  . Hypertension Mother   . Osteopenia Mother   . Diabetes Father   . Skin cancer Father   . Prostate cancer Father   . Esophageal  cancer Father   . Hypertension Father   . Hyperlipidemia Father      HOME MEDICATIONS: Allergies as of 07/29/2020   No Known Allergies     Medication List       Accurate as of July 29, 2020  1:45 PM. If you have any questions, ask your nurse or doctor.        Aimovig 140 MG/ML Soaj Generic drug: Erenumab-aooe Inject into the skin every 30 (thirty) days.   ALPRAZolam 1 MG tablet Commonly known as: XANAX Take 2 mg by mouth at bedtime.   clindamycin 1 % external solution Commonly known as: CLEOCIN T 3 times/day as needed-between meals & bedtime.   cyclobenzaprine 10 MG tablet Commonly known as: FLEXERIL TAKE 1/2 TABLET BY MOUTH EVERY MORNING AND AT NOON, AND TAKE 1 TABLET EVERY DAY AT BEDTIME   EPINEPHrine 0.3 mg/0.3 mL Soaj injection Commonly known as: EPI-PEN INJECT AS DIRECTED FOR LIFE THREATENING REACTION   fluticasone 50 MCG/ACT nasal spray Commonly known as: FLONASE Place 2 sprays into both nostrils as needed.   meloxicam 15 MG tablet Commonly known as: MOBIC Take 15 mg by mouth daily.   metFORMIN 500 MG 24 hr tablet Commonly known as: GLUCOPHAGE-XR TAKE 3 TABLETS BY MOUTH EVERY DAY   Nurtec 75 MG Tbdp Generic drug: Rimegepant Sulfate Take 1 tablet  by mouth daily as needed.   nystatin-triamcinolone ointment Commonly known as: MYCOLOG APPLY TOPICALLY TO THE AFFECTED AREA 2 TIMES A DAY.   oxyCODONE-acetaminophen 10-325 MG tablet Commonly known as: PERCOCET Take 1 tablet by mouth 2 (two) times daily as needed.   Prempro 0.45-1.5 MG tablet Generic drug: estrogen (conjugated)-medroxyprogesterone Take 1 tablet by mouth daily.   traZODone 150 MG tablet Commonly known as: DESYREL Take 300 mg by mouth at bedtime.   triamcinolone cream 0.5 % Commonly known as: KENALOG APPLY TO AFFECTED AREA TWICE DAILY AS NEEDED   Trintellix 20 MG Tabs tablet Generic drug: vortioxetine HBr Take 20 mg by mouth daily.   Xtampza ER 18 MG C12a Generic drug:  oxyCODONE ER Take 1 capsule by mouth every 12 (twelve) hours.         OBJECTIVE:   PHYSICAL EXAM: VS: BP 126/76   Pulse (!) 109   Ht _0  (1.575 m)   Wt 109 lb 4 oz (49.6 kg)   SpO2 98%   BMI 19.98 kg/m    EXAM: General: Pt appears well and is in NAD  Neck: General: Supple without adenopathy. Thyroid: Thyroid size normal.  No goiter or nodules appreciated. No thyroid bruit.  Lungs: Clear with good BS bilat with no rales, rhonchi, or wheezes  Heart: Auscultation: RRR.  Abdomen: Normoactive bowel sounds, soft, nontender, without masses or organomegaly palpable  Extremities:  BL LE: No pretibial edema normal ROM and strength.  Skin: Evidence of tanning noted more over the abdomen. Less over the face and hands  Mental Status: Judgment, insight: Intact Orientation: Oriented to time, place, and person Mood and affect: No depression, anxiety, or agitation     DATA REVIEWED:  Results for SARINITY, DICICCO (MRN [id number]) as of 07/31/2020 09:13  Ref. Range 07/29/2020 13:43  Sodium Latest Ref Range: 134 - 144 mmol/L 137  Potassium Latest Ref Range: 3.5 - 5.2 mmol/L 5.0  Chloride Latest Ref Range: 96 - 106 mmol/L 100  CO2 Latest Ref Range: 20 - 29 mmol/L 21  Glucose Latest Ref Range: 65 - 99 mg/dL 115 (H)  BUN Latest Ref Range: 6 - 24 mg/dL 15  Creatinine Latest Ref Range: 0.57 - 1.00 mg/dL 0.98  Calcium Latest Ref Range: 8.7 - 10.2 mg/dL 9.5  BUN/Creatinine Ratio Latest Ref Range: 9 - 23  15  EGFR Latest Ref Range: >59 mL/min/1.73 68  Albumin Latest Ref Range: 3.8 - 4.9 g/dL 4.2  Total CHOL/HDL Ratio Latest Ref Range: 0.0 - 4.4 ratio 3.3  Cholesterol, Total Latest Ref Range: 100 - 199 mg/dL 202 (H)  HDL Cholesterol Latest Ref Range: >39 mg/dL 62  Triglycerides Latest Ref Range: 0 - 149 mg/dL 212 (H)  VLDL Cholesterol Cal Latest Ref Range: 5 - 40 mg/dL 36  LDL Chol Calc (NIH) Latest Ref Range: 0 - 99 mg/dL 104 (H)  Vitamin D, 25-Hydroxy Latest Ref Range: 30.0 - 100.0 ng/mL  84.0  Hemoglobin A1C Latest Ref Range: 4.8 - 5.6 % 5.1  Est. average glucose Bld gHb Est-mCnc Latest Units: mg/dL 100      ASSESSMENT / PLAN / RECOMMENDATIONS:   1. Hypervitaminsosis D:    -  Resolved  - She had stopped tanning and the only vitamin she takes is the one with her calcium   2.  Low bone density:   -DXA in 07/2019 showed slight low bone density .    - She is on MVI daily      3. PCOS :    -  A1c normal - Tolerating Metformin TID , will continue  4. Dyslipidemia :  - Tg elevated, will recommend low fat diet and starting OTC  Fish oil     Medication  Fish oil 1000 mg daily      Follow-up in 1 yr    Signed electronically by: Mack Guise, MD  [facility]  [facility] [address] [place], [place] [address] Phone: [phone] FAX: [phone]      CC: Lois Hux[address], [place] [address] Phone: [phone]  Fax: [phone]   Return to Endocrinology clinic as below: No future appointments.

## 2020-07-30 LAB — HEMOGLOBIN A1C
Est. average glucose Bld gHb Est-mCnc: 100 mg/dL
Hgb A1c MFr Bld: 5.1 % (ref 4.8–5.6)

## 2020-07-30 LAB — BASIC METABOLIC PANEL
BUN/Creatinine Ratio: 15 (ref 9–23)
BUN: 15 mg/dL (ref 6–24)
CO2: 21 mmol/L (ref 20–29)
Calcium: 9.5 mg/dL (ref 8.7–10.2)
Chloride: 100 mmol/L (ref 96–106)
Creatinine, Ser: 0.98 mg/dL (ref 0.57–1.00)
Glucose: 115 mg/dL — ABNORMAL HIGH (ref 65–99)
Potassium: 5 mmol/L (ref 3.5–5.2)
Sodium: 137 mmol/L (ref 134–144)
eGFR: 68 mL/min/{1.73_m2} (ref >59)

## 2020-07-30 LAB — LIPID PANEL
Chol/HDL Ratio: 3.3 ratio (ref 0.0–4.4)
Cholesterol, Total: 202 mg/dL — ABNORMAL HIGH (ref 100–199)
HDL: 62 mg/dL (ref >39)
LDL Chol Calc (NIH): 104 mg/dL — ABNORMAL HIGH (ref 0–99)
Triglycerides: 212 mg/dL — ABNORMAL HIGH (ref 0–149)
VLDL Cholesterol Cal: 36 mg/dL (ref 5–40)

## 2020-07-30 LAB — ALBUMIN: Albumin: 4.2 g/dL (ref 3.8–4.9)

## 2020-07-30 LAB — VITAMIN D 25 HYDROXY (VIT D DEFICIENCY, FRACTURES): Vit D, 25-Hydroxy: 84 ng/mL (ref 30.0–100.0)

## 2020-07-31 ENCOUNTER — Encounter: Payer: Self-pay | Admitting: Internal Medicine

## 2020-07-31 DIAGNOSIS — E781 Pure hyperglyceridemia: Secondary | ICD-10-CM | POA: Insufficient documentation

## 2020-08-05 DIAGNOSIS — M461 Sacroiliitis, not elsewhere classified: Secondary | ICD-10-CM | POA: Diagnosis not present

## 2020-08-12 DIAGNOSIS — G894 Chronic pain syndrome: Secondary | ICD-10-CM | POA: Diagnosis not present

## 2020-08-12 DIAGNOSIS — M47812 Spondylosis without myelopathy or radiculopathy, cervical region: Secondary | ICD-10-CM | POA: Diagnosis not present

## 2020-08-12 DIAGNOSIS — G43009 Migraine without aura, not intractable, without status migrainosus: Secondary | ICD-10-CM | POA: Diagnosis not present

## 2020-08-12 DIAGNOSIS — M25562 Pain in left knee: Secondary | ICD-10-CM | POA: Diagnosis not present

## 2020-08-16 DIAGNOSIS — H04123 Dry eye syndrome of bilateral lacrimal glands: Secondary | ICD-10-CM | POA: Diagnosis not present

## 2020-08-21 DIAGNOSIS — R410 Disorientation, unspecified: Secondary | ICD-10-CM | POA: Diagnosis not present

## 2020-08-21 DIAGNOSIS — E161 Other hypoglycemia: Secondary | ICD-10-CM | POA: Diagnosis not present

## 2020-08-21 DIAGNOSIS — R4182 Altered mental status, unspecified: Secondary | ICD-10-CM | POA: Diagnosis not present

## 2020-08-21 DIAGNOSIS — E162 Hypoglycemia, unspecified: Secondary | ICD-10-CM | POA: Diagnosis not present

## 2020-09-09 DIAGNOSIS — G43009 Migraine without aura, not intractable, without status migrainosus: Secondary | ICD-10-CM | POA: Diagnosis not present

## 2020-09-09 DIAGNOSIS — M25562 Pain in left knee: Secondary | ICD-10-CM | POA: Diagnosis not present

## 2020-09-09 DIAGNOSIS — M47812 Spondylosis without myelopathy or radiculopathy, cervical region: Secondary | ICD-10-CM | POA: Diagnosis not present

## 2020-09-09 DIAGNOSIS — G894 Chronic pain syndrome: Secondary | ICD-10-CM | POA: Diagnosis not present

## 2020-09-19 DIAGNOSIS — F3342 Major depressive disorder, recurrent, in full remission: Secondary | ICD-10-CM | POA: Diagnosis not present

## 2020-10-07 DIAGNOSIS — G894 Chronic pain syndrome: Secondary | ICD-10-CM | POA: Diagnosis not present

## 2020-10-07 DIAGNOSIS — G43009 Migraine without aura, not intractable, without status migrainosus: Secondary | ICD-10-CM | POA: Diagnosis not present

## 2020-10-07 DIAGNOSIS — M47812 Spondylosis without myelopathy or radiculopathy, cervical region: Secondary | ICD-10-CM | POA: Diagnosis not present

## 2020-10-07 DIAGNOSIS — M25562 Pain in left knee: Secondary | ICD-10-CM | POA: Diagnosis not present

## 2020-10-16 ENCOUNTER — Encounter: Payer: Self-pay | Admitting: Neurology

## 2020-10-16 ENCOUNTER — Ambulatory Visit: Payer: BC Managed Care – PPO | Admitting: Neurology

## 2020-10-16 VITALS — BP 100/67 | HR 99 | Ht 61.0 in | Wt 104.0 lb

## 2020-10-16 DIAGNOSIS — G4452 New daily persistent headache (NDPH): Secondary | ICD-10-CM

## 2020-10-16 DIAGNOSIS — G9600 Cerebrospinal fluid leak, unspecified: Secondary | ICD-10-CM | POA: Diagnosis not present

## 2020-10-16 DIAGNOSIS — R51 Headache with orthostatic component, not elsewhere classified: Secondary | ICD-10-CM

## 2020-10-16 NOTE — Progress Notes (Signed)
[facility]    Provider:  Dr Lucia Gaskins Requesting Provider: Autumn Patty, MD Primary Care Provider:  Wilfrid Lund, PA  CC:  Headache  HPI:  Shelly Myers is a 55 y.o. female here as requested by Dr. Maurice Small for chronic headache. PMHx PCOS, osteopenia, DDD, arthritis. Patient is here alone, has had headaches since she was about 55 years old, evolved into some sort of "irritating", she saw ENT and was cleared, hearing test was fine, when she is upright her ears get a clogged feeling and she cannot hear herself breathe in her head like a head cold, pain, pressure, tried "just about everything", Aimovig, Flexeril, meloxicam, Percocet, Nurtec, Imitrex, Maxalt, amitriptyline, Topamax and has seen neurology off and on. The Christmas before she started having issues with her ears clogging, no inciting events, she went to her doctor and she started prednisone that worked temporarily, symptoms went away with short course of steroids and at some point came back, they tried prednisone again and it did not work, within 2 hours of being supine/upright she gets the symptoms in the ears, turns into a pretty bad pain between her ears like her head is in a vice, when she lays down the symptoms resolve shortly within a few hours. Never wakes up with the head. She has concomitant migraines but those are different than her daily headaches. No other focal neurologic deficits, associated symptoms, inciting events or modifiable factors.  Reviewed notes, labs and imaging from outside physicians, which showed:  MRI brain 04/2020: normal reviewed images MRI cervical spine: : reviewed images 1. Chronic degenerative spondylosis from C3-4 through C6-7. Spinal stenosis with AP diameter of the canal as narrow as 6 mm at C5-6. Effacement of the subarachnoid space and slight indentation of the cord at C4-5 and C5-6. 2. Foraminal narrowing that could possibly affect the exiting nerve roots bilaterally at  C4-5, on the right at C5-6 and on the left at C6-7.   I reviewed Dr. Romana Juniper notes:  She has a 1 year history of ear pain, bilateral, associated with aural fullness, dull, severe in intensity, the only significantly palliating or provoking features that it is resolved with laying flat, she has more global headaches that are migrainous and respond to her migraine regimen but these are not like her other headaches, she saw ENT and there was no explanation, she has known significant cervical disease and was referred to 2 [place] [facility] for explanation, she denies any pain in her throat tongue, no numbness in the ears, no pain over the posterior auricular region, no lancinating occipital distribution or facial pain, no radicular pain, no difficulty with fine motor movements in the bilateral upper or lower extremities.  MRI of the cervical spine showed left greater than right foraminal stenosis at C4-C5.  At C5-C6 there is right-sided severe foraminal stenosis.  At C6-C7 there is a moderate left foraminal stenosis.  There is fairly remarkable significant spondylosis of C3-C4 to C6-C7 with mild to moderate canal stenosis at C4-C5, C5-C6 and C6-C7.  However her aural fullness was not consistent with these findings.  Review of Systems: Patient complains of symptoms per HPI as well as the following symptoms: headache. Pertinent negatives and positives per HPI. All others negative.   Social History   Socioeconomic History  . Marital status: Married    Spouse name: Not on file  . Number of children: 1  . Years of education: Not on file  . Highest education level: Not on file  Occupational  History  . Not on file  Tobacco Use  . Smoking status: Never Smoker  . Smokeless tobacco: Never Used  Vaping Use  . Vaping Use: Never used  Substance and Sexual Activity  . Alcohol use: Not Currently    Comment: once or twice a year around a holiday  . Drug use: Never  . Sexual activity: Not on file   Other Topics Concern  . Not on file  Social History Narrative   Lives at home with husband   Right handed   Caffeine: 3 cups/day   Social Determinants of Health   Financial Resource Strain: Not on file  Food Insecurity: Not on file  Transportation Needs: Not on file  Physical Activity: Not on file  Stress: Not on file  Social Connections: Not on file  Intimate Partner Violence: Not on file    Family History  Problem Relation Age of Onset  . Hypertension Mother   . Osteopenia Mother   . Diabetes Father   . Skin cancer Father   . Prostate cancer Father   . Esophageal cancer Father   . Hypertension Father   . Hyperlipidemia Father   . Headache Neg Hx   . Migraines Neg Hx     Past Medical History:  Diagnosis Date  . Allergy   . Arthritis   . DDD (degenerative disc disease), cervical   . Osteopenia   . PCOS (polycystic ovarian syndrome)     Patient Active Problem List   Diagnosis Date Noted  . NDPH (new daily persistent headache) 10/16/2020  . Hypertriglyceridemia 07/31/2020  . Low bone density 09/20/2019  . Osteopenia 07/21/2019  . PCOS (polycystic ovarian syndrome) 04/17/2019  . Hypervitaminosis D 04/17/2019    Past Surgical History:  Procedure Laterality Date  . BREAST ENHANCEMENT SURGERY    . HAND SURGERY Bilateral   . TONSILLECTOMY      Current Outpatient Medications  Medication Sig Dispense Refill  . AIMOVIG 140 MG/ML SOAJ Inject into the skin every 30 (thirty) days.    . ALPRAZolam (XANAX) 1 MG tablet Take 2 mg by mouth at bedtime.    . buprenorphine (BUTRANS) 10 MCG/HR PTWK 1 patch once a week.    . clindamycin (CLEOCIN T) 1 % external solution 3 times/day as needed-between meals & bedtime.     . cyclobenzaprine (FLEXERIL) 10 MG tablet TAKE 1/2 TABLET BY MOUTH EVERY MORNING AND AT NOON, AND TAKE 1 TABLET EVERY DAY AT BEDTIME    . EPINEPHrine 0.3 mg/0.3 mL IJ SOAJ injection INJECT AS DIRECTED FOR LIFE THREATENING REACTION    . fluticasone  (FLONASE) 50 MCG/ACT nasal spray Place 2 sprays into both nostrils as needed.    . meloxicam (MOBIC) 15 MG tablet Take 15 mg by mouth daily.    . metFORMIN (GLUCOPHAGE-XR) 500 MG 24 hr tablet Take 3 tablets (1,500 mg total) by mouth daily. 270 tablet 3  . nystatin-triamcinolone ointment (MYCOLOG) APPLY TOPICALLY TO THE AFFECTED AREA 2 TIMES A DAY.    Marland Kitchen oxyCODONE-acetaminophen (PERCOCET) 10-325 MG tablet Take 1 tablet by mouth 2 (two) times daily as needed.    Marland Kitchen PREMPRO 0.45-1.5 MG tablet Take 1 tablet by mouth daily.    . rizatriptan (MAXALT-MLT) 10 MG disintegrating tablet Take 10 mg by mouth daily as needed.    . traZODone (DESYREL) 150 MG tablet Take 300 mg by mouth at bedtime.    . triamcinolone cream (KENALOG) 0.5 % APPLY TO AFFECTED AREA TWICE DAILY AS NEEDED    .  TRINTELLIX 20 MG TABS tablet Take 20 mg by mouth daily.     No current facility-administered medications for this visit.    Allergies as of 10/16/2020  . (No Known Allergies)    Vitals: BP 100/67 (BP Location: Right Arm, Patient Position: Sitting)   Pulse 99   Ht 5\' 1"  (1.549 m)   Wt 104 lb (47.2 kg)   BMI 19.65 kg/m  Last Weight:  Wt Readings from Last 1 Encounters:  10/16/20 104 lb (47.2 kg)   Last Height:   Ht Readings from Last 1 Encounters:  10/16/20 5\' 1"  (1.549 m)     Physical exam: Exam: Gen: NAD, conversant, well nourised, mild hyperkinetic movements in the arms and legs.                 CV: RRR, no MRG. No Carotid Bruits. No peripheral edema, warm, nontender Eyes: Conjunctivae clear without exudates or hemorrhage  Neuro: Detailed Neurologic Exam  Speech:    Speech is normal; fluent and spontaneous with normal comprehension.  Cognition:    The patient is oriented to person, place, and time;     recent and remote memory intact;     language fluent;     normal attention, concentration,     fund of knowledge Cranial Nerves:    The pupils are equal, round, and reactive to light. The fundi are  flat. Visual fields are full to finger confrontation. Extraocular movements are intact. Trigeminal sensation is intact and the muscles of mastication are normal. The face is symmetric. The palate elevates in the midline. Hearing intact. Voice is normal. Shoulder shrug is normal. The tongue has normal motion without fasciculations.   Coordination:    Normal finger to nose  Gait:   normal.   Motor Observation:    No asymmetry, no atrophy, and no involuntary movements noted. Tone:    Normal muscle tone.    Posture:    Posture is normal. normal erect    Strength:    Strength is V/V in the upper and lower limbs.      Sensation: intact to LT     Reflex Exam:  DTR's:    Deep tendon reflexes in the upper and lower extremities are normal bilaterally.   Toes:    The toes are downgoing bilaterally.   Clonus:    Clonus is absent.    Assessment/Plan:  Patient with headaches. New daily persistent, with a significant positional quality, starts shortly after getting up in the morning, resolves with laying down. Patient has resorted to laying down all day long with short periods up to do laundry or chores then right back to laying down, significantly affecting her life. Sounds like a low csf headache, csf leak somewhere, she has been treated with multiple medications without relief even the CGRP injections which tells me this is unlikely to be typical or atypical migraine. Discussed with patient in detail, csf leaks are very difficult to find. If she has any drainage from her nose please capture it into the cup we gave her for testing and will also order CT Myelogram C/T/L. The hearing difficulty when standing may be compression of CN VIII when the brain sags during standing; often times we do not see signs of low csf on MRI of the brain either especially with a slow leak.   Neurosurgery: Moderate to severe cervical spinal stenosis with brisk reflexes and 1 beats clonus AJs, myelopathic signs. Will  check in with Dr. 10/18/20 who evaluated  her. I do not think this is the cause of her current symptoms but may need addressing. Will see what resukts of the cT cervical myelogram show.   Orders Placed This Encounter  Procedures  . CT CERVICAL SPINE W CONTRAST  . CT THORACIC SPINE W CONTRAST  . CT LUMBAR SPINE W CONTRAST  . DG Myelogram 2+ Regions  . Beta-2 Transferrin   No orders of the defined types were placed in this encounter.  Discussed: To prevent or relieve headaches, try the following: Cool Compress. Lie down and place a cool compress on your head.  Avoid headache triggers. If certain foods or odors seem to have triggered your migraines in the past, avoid them. A headache diary might help you identify triggers.  Include physical activity in your daily routine. Try a daily walk or other moderate aerobic exercise.  Manage stress. Find healthy ways to cope with the stressors, such as delegating tasks on your to-do list.  Practice relaxation techniques. Try deep breathing, yoga, massage and visualization.  Eat regularly. Eating regularly scheduled meals and maintaining a healthy diet might help prevent headaches. Also, drink plenty of fluids.  Follow a regular sleep schedule. Sleep deprivation might contribute to headaches Consider biofeedback. With this mind-body technique, you learn to control certain bodily functions -- such as muscle tension, heart rate and blood pressure -- to prevent headaches or reduce headache pain.    Proceed to emergency room if you experience new or worsening symptoms or symptoms do not resolve, if you have new neurologic symptoms or if headache is severe, or for any concerning symptom.   Provided education and documentation from American headache Society toolbox including articles on: chronic migraine medication overuse headache, chronic migraines, prevention of migraines, behavioral and other nonpharmacologic treatments for headache.   Cc: Wilfrid LundBecker, Anna  G, PA,  Wilfrid LundBecker, Anna G, [place]  Naomie DeanAntonia Skyann Ganim, MD  [facility] [address] [place], [place] [id number]  Phone [phone] Fax [phone]

## 2020-10-16 NOTE — Patient Instructions (Addendum)
Ct Myelogram Fluid if you can and I will test it. If not there is another study we can do on the brain to see if thee is any leaking  I will discuss your neck with Dr. Maurice Small   Myelogram  A myelogram is an imaging study of the spinal cord and the places where nerves attach to the spinal cord (nerve roots). A dye (contrast material) is injected into the spine before the X-ray. This provides a clearer image for your health care provider to see. You may need this study done if you have a spinal cord problem that cannot be diagnosed with other imaging studies, such as a CT scan or an MRI. You may also have this study to check your spine after surgery. Tell a health care provider about:  Any allergies you have, especially to iodine.  All medicines you are taking, including vitamins, herbs, eye drops, creams, and over-the-counter medicines.  Any problems you or family members have had with anesthetic medicines or contrast material.  Any blood disorders you have.  Any surgeries you have had.  Any medical conditions you have or have had, including asthma.  Whether you are pregnant or may be pregnant. What are the risks? Generally, this is a safe procedure. However, problems may occur, including:  Infection.  Bleeding.  Allergic reaction to medicines or dyes.  Damage to your spinal cord or nerves.  Loss or leaking of spinal fluid. This can lead to headaches.  Damage to kidneys.  Seizures. This is rare. What happens before the procedure?  Follow instructions from your health care provider about eating or drinking restrictions. You may be asked to drink more fluids.  Ask your health care provider about changing or stopping your regular medicines. This is especially important if you are taking diabetes medicines or blood thinners.  Plan to have someone take you home from the hospital or clinic.  If you will be going home right after the procedure, plan to have someone with you  for 24 hours. What happens during the procedure?  You will lie face down on a table.  Your health care provider will locate the best injection site on your spine. This is most often in the lower back.  The area of injection will be washed with soap.  You will be given a medicine to numb the area (local anesthetic).  Your health care provider will insert a long needle into the space around your spinal cord (subarachnoid space).  A sample of spinal fluid may be taken and sent to the lab for testing.  The contrast material will be injected into the subarachnoid space.  The exam table may be tilted to help the contrast material flow up or down your spine.  The X-ray will take images of your spinal cord for your health care provider to examine.  A bandage (dressing) may be placed over the injection site. The procedure may vary among health care providers and hospitals. What can I expect after this procedure?  Your blood pressure, heart rate, breathing rate, and blood oxygen level may be monitored until you leave the hospital or clinic.  You may have: ? Soreness on your injection site. ? A mild headache.  You will be asked to lie down with your head raised (elevated). This reduces the risk of a headache.  It is up to you to get the results of your procedure. Ask your health care provider, or the department that is doing the procedure, when your results  will be ready. Follow these instructions at home:  Rest as told by your health care provider. Lie flat with your head slightly elevated to reduce the risk of a headache.  Do not bend, lift, or do hard work for 24-48 hours, or as told by your health care provider.  Take over-the-counter and prescription medicines only as told by your health care provider.  Take care of your dressing as told by your health care provider.  Drink enough fluid to keep your urine pale yellow.  Bathe or shower as told by your health care provider.    Contact a health care provider if:  You have a fever.  You have a headache that lasts longer than 24 hours.  You feel nauseous or vomit.  You have a stiff neck or numbness in your legs.  You are unable to urinate or have a bowel movement.  You develop a rash, itching, or sneezing. Get help right away if:  You have new symptoms or your symptoms get worse.  You have a seizure.  You have trouble breathing. Summary  A myelogram is an imaging study of the spinal cord and the places where nerves attach to the spinal cord (nerve roots).  Before the procedure, follow instructions from your health care provider about changing or stopping your regular medicines, and eating and drinking restrictions.  After this procedure, you will be asked to lie down with your head raised (elevated). This reduces your risk of a headache.  Do not bend, lift, or do hard work for 24-48 hours, or as told by your health care provider.  Contact a health care provider if you have a stiff neck or numbness in your legs. Get help right away if symptoms get worse, or you have a seizure or trouble breathing. This information is not intended to replace advice given to you by your health care provider. Make sure you discuss any questions you have with your health care provider. Document Revised: 07/27/2018 Document Reviewed: 07/28/2018 Elsevier Patient Education  2021 [employer].

## 2020-10-23 ENCOUNTER — Telehealth: Payer: Self-pay

## 2020-10-30 NOTE — Progress Notes (Signed)
Phone call to patient to verify medication list and allergies for myelogram procedure. Pt instructed to hold Maxalt, Trazodone, and Trintellix for 48hrs prior to myelogram appointment time and 24 hours after appointment. Pt also instructed to have a driver the day of the procedure, the procedure would take around 2 hours, and discharge instructions discussed. Pt verbalized understanding.

## 2020-11-05 DIAGNOSIS — M25562 Pain in left knee: Secondary | ICD-10-CM | POA: Diagnosis not present

## 2020-11-05 DIAGNOSIS — G894 Chronic pain syndrome: Secondary | ICD-10-CM | POA: Diagnosis not present

## 2020-11-05 DIAGNOSIS — G43009 Migraine without aura, not intractable, without status migrainosus: Secondary | ICD-10-CM | POA: Diagnosis not present

## 2020-11-05 DIAGNOSIS — M47812 Spondylosis without myelopathy or radiculopathy, cervical region: Secondary | ICD-10-CM | POA: Diagnosis not present

## 2020-11-12 ENCOUNTER — Ambulatory Visit
Admission: RE | Admit: 2020-11-12 | Discharge: 2020-11-12 | Disposition: A | Discharge status: Home / Self Care | Payer: BC Managed Care – PPO | Source: Ambulatory Visit | Attending: Neurology | Admitting: Neurology

## 2020-11-12 ENCOUNTER — Telehealth: Payer: Self-pay | Admitting: Neurology

## 2020-11-12 ENCOUNTER — Other Ambulatory Visit: Payer: Self-pay

## 2020-11-12 DIAGNOSIS — M5136 Other intervertebral disc degeneration, lumbar region: Secondary | ICD-10-CM | POA: Diagnosis not present

## 2020-11-12 DIAGNOSIS — G9600 Cerebrospinal fluid leak, unspecified: Secondary | ICD-10-CM

## 2020-11-12 DIAGNOSIS — M47812 Spondylosis without myelopathy or radiculopathy, cervical region: Secondary | ICD-10-CM | POA: Diagnosis not present

## 2020-11-12 DIAGNOSIS — M4134 Thoracogenic scoliosis, thoracic region: Secondary | ICD-10-CM | POA: Diagnosis not present

## 2020-11-12 DIAGNOSIS — M50222 Other cervical disc displacement at C5-C6 level: Secondary | ICD-10-CM | POA: Diagnosis not present

## 2020-11-12 DIAGNOSIS — R51 Headache with orthostatic component, not elsewhere classified: Secondary | ICD-10-CM

## 2020-11-12 DIAGNOSIS — G4452 New daily persistent headache (NDPH): Secondary | ICD-10-CM

## 2020-11-12 DIAGNOSIS — M5124 Other intervertebral disc displacement, thoracic region: Secondary | ICD-10-CM | POA: Diagnosis not present

## 2020-11-12 DIAGNOSIS — M5126 Other intervertebral disc displacement, lumbar region: Secondary | ICD-10-CM | POA: Diagnosis not present

## 2020-11-12 DIAGNOSIS — M4126 Other idiopathic scoliosis, lumbar region: Secondary | ICD-10-CM | POA: Diagnosis not present

## 2020-11-12 MED ORDER — DIAZEPAM 5 MG PO TABS
10.0000 mg | ORAL_TABLET | Freq: Once | ORAL | Status: AC
  Administered 2020-11-12: 10 mg via ORAL

## 2020-11-12 MED ORDER — IOPAMIDOL (ISOVUE-M 300) INJECTION 61%
10.0000 mL | Freq: Once | INTRAMUSCULAR | Status: AC | PRN
  Administered 2020-11-12: 10 mL via INTRATHECAL

## 2020-11-12 NOTE — Telephone Encounter (Signed)
Please call patient: Per Dr. Karin Golden, a Site of CSF leak not demonstrated(but they are difficult to find). But he noticed 2 areas of suspicion, the most suspicious are was an unusual appearance of the end of the root sleeve on the right at S1, with a somewhat angular appearance. If empirical blood patch is being considered, I would do it in this region.  Please discuss with patient my recommendation: I would suggest an empiric blood patch as Dr. Karin Golden suggests. If she is ok with it, I will place the order just let me know. They will take some of her blood abd inject it around the site where the bleed may be and see if the blood will clot and stop the leak. We can do this several times depending on how much it helps. thanks

## 2020-11-12 NOTE — Progress Notes (Signed)
Pt reports she has been off of her Maxalt, Trazodone, and Trintellix for at least 48 hours.

## 2020-11-12 NOTE — Discharge Instructions (Signed)
Myelogram Discharge Instructions  Go home and rest quietly as needed. You may resume normal activities; however, do not exert yourself strongly or do any heavy lifting today and tomorrow.   DO NOT drive today.    You may resume your normal diet and medications unless otherwise indicated. Drink lots of extra fluids today and tomorrow.   The incidence of headache, nausea, or vomiting is about 5% (one in 20 patients).  If you develop a headache, lie flat for 24 hours and drink plenty of fluids until the headache goes away.  Caffeinated beverages may be helpful. If when you get up you still have a headache when standing, go back to bed and force fluids for another 24 hours.   If you develop severe nausea and vomiting or a headache that does not go away with the flat bedrest after 48 hours, please call [phone].   Call your physician for a follow-up appointment.  The results of your myelogram will be sent directly to your physician by the following day.  If you have any questions or if complications develop after you arrive home, please call [phone].  Discharge instructions have been explained to the patient.  The patient, or the person responsible for the patient, fully understands these instructions.   Thank you for visiting our office today.    YOU MAY RESUME YOUR MAXALT, TRAZODONE, AND TRINTELLIX TOMORROW 11/13/20 AT 1:00 PM

## 2020-11-13 NOTE — Telephone Encounter (Signed)
Spoke with patient and discussed message from Dr. Lucia Gaskins regarding results of lumbar Myelogram.  The patient verbalized understanding and has agreed to proceed with trial of a blood patch.  Her questions were answered.  She understands the order will be placed and GI will give her a call to schedule.

## 2020-11-19 ENCOUNTER — Other Ambulatory Visit: Payer: Self-pay | Admitting: Neurology

## 2020-11-19 DIAGNOSIS — G9600 Cerebrospinal fluid leak, unspecified: Secondary | ICD-10-CM

## 2020-11-19 NOTE — Telephone Encounter (Signed)
Sent order to [facility] @ GI. Phone: [phone].

## 2020-11-27 ENCOUNTER — Telehealth: Payer: Self-pay | Admitting: Neurology

## 2020-11-27 NOTE — Telephone Encounter (Signed)
Patient was scheduled in an EMG spot and Dr. Lucia Gaskins needed  her moved out of this spot for another patent. I left her a voice mail letting her know her appt time was moved to 2pm and to call us back if this didn't work for her.

## 2020-11-27 NOTE — Telephone Encounter (Signed)
Note on chart states pt did not want to proceed with blood patch but wanted to talk with doctor instead. I have called the patient and LVM (ok per DPR) asking for call back to discuss doing a telephone visit instead (ok per Dr Lucia Gaskins) and informed pt the time would need to change but could still do tomorrow.

## 2020-11-27 NOTE — Telephone Encounter (Signed)
I have an appointment with patient tomorrow but does not appear she had the blood patch yet. Can you reschedule her for after the blood patch?

## 2020-11-28 ENCOUNTER — Ambulatory Visit: Payer: BC Managed Care – PPO | Admitting: Neurology

## 2020-11-28 ENCOUNTER — Encounter: Payer: Self-pay | Admitting: Neurology

## 2020-11-28 ENCOUNTER — Other Ambulatory Visit: Payer: Self-pay

## 2020-11-28 VITALS — BP 123/77 | HR 102 | Ht 61.0 in | Wt 104.0 lb

## 2020-11-28 DIAGNOSIS — R51 Headache with orthostatic component, not elsewhere classified: Secondary | ICD-10-CM

## 2020-11-28 NOTE — Patient Instructions (Addendum)
Spoke to [facility] imaging appointment for blood patch Dr. Karin Golden. July 14th at 9:45 with Dr. Karin Golden.   Epidural Blood Patch for Spinal Headache, Care After This sheet gives you information about how to care for yourself after your procedure. Your health care provider may also give you more specific instructions. If you have problems or questions, contact your health careprovider. What can I expect after the procedure? After the procedure, it is common to have: Relief from your headache almost right away, or slow easing of pain over the next 24 hours. Mild backaches. These may last for a few days. A mild feeling of prickly, tingly, or numb skin (paresthesia). Neck pain. Pain down your arm or leg (radiating pain). Follow these instructions at home:  Injection site care  Follow instructions from your health care provider about how to take care of your injection site. Make sure you: Wash your hands with soap and water before and after you change your bandage (dressing). If soap and water are not available, use hand sanitizer. Change or remove your dressing as told by your health care provider. Check your injection area every day for signs of infection. Check for: Redness, swelling, or pain. Fluid or blood. Warmth. Pus or a bad smell.  Activity  For the first 24 hours after the procedure, rest in bed as much as you can. Do not drive for 24 hours if you were given a sedative during your procedure. When picking up items from a low position, squat by bending your knees. Do not bend at the waist. Avoid too much straining, including while using the toilet. This can cause the blood patch to move out of position and cause the headache to return. Do not lift anything that is heavier than 10 lb (4.5 kg), or the limit that you are told, until your health care provider says that it is safe. Return to your normal activities as told by your health care provider. Ask your health care provider what  activities are safe for you.  General instructions Drink enough fluids to keep your urine pale yellow. Do not take baths, swim, or use a hot tub until your health care provider approves. Ask your health care provider if you may take showers. You may only be allowed to take sponge baths. Take over-the-counter and prescription medicines only as told by your health care provider. Keep all follow-up visits as told by your health care provider. This is important. Contact a health care provider if: You have redness, swelling, or pain around your injection site. You have fluid or blood coming from your injection site. Your injection site feels warm to the touch. You have pus or a bad smell coming from your injection site. You have a fever. You have back pain or pain that goes down your legs, or you have trouble with your bowels or bladder. Your headache comes back. Get help right away if you have: A very bad headache. Nausea or vomiting. Summary After the procedure, it is common to have relief from your headache, mild backaches, mild numbness, neck pain, and some pain down your arm or leg. Follow instructions from your health care provider about how to take care of your injection site. For the first 24 hours after the procedure, rest in bed as much as you can. Get help right away if you have a very bad headache, nausea, or vomiting. This information is not intended to replace advice given to you by your health care provider. Make sure you  discuss any questions you have with your healthcare provider. Document Revised: 12/14/2018 Document Reviewed: 12/14/2018 Elsevier Patient Education  2022 [employer].

## 2020-11-28 NOTE — Progress Notes (Signed)
[facility]    Provider:  Dr Lucia Gaskins Requesting Provider: Autumn Patty, MD Primary Care Provider:  Wilfrid Lund, PA  CC:  Headache  Interval history 11/28/2020: CT Myelogram did not find a leak however we discussed it is very difficult to find csf leaks.  She also feels the low-pressure is the diagnosis after reading about it. We discussed blood patch, she would like to go forward with it, targeting the s1 where Dr. Karin Golden said looked suspicious or per Dr. Karin Golden. Discussed we may try 2-3 blood patches and if no response we will send to [facility].   Reviewed report with patient today:  PROCEDURE: LUMBAR PUNCTURE FOR CERVICAL LUMBAR AND THORACIC MYELOGRAM   CERVICAL AND LUMBAR AND THORACIC MYELOGRAM   CT CERVICAL MYELOGRAM   CT LUMBAR MYELOGRAM   CT THORACIC MYELOGRAM   After thorough discussion of risks and benefits of the procedure including bleeding, infection, injury to nerves, blood vessels, adjacent structures as well as headache and CSF leak, written and oral informed consent was obtained. Consent was obtained by Dr. Paulina Fusi.   Patient was positioned prone on the fluoroscopy table. Local anesthesia was provided with 1% lidocaine without epinephrine after prepped and draped in the usual sterile fashion. Puncture was performed at L3-4 using a 3 inch 22-gauge spinal needle via left paramedian approach. Using a single pass through the dura, the needle was placed within the thecal sac, with return of clear CSF. 10 mL Isovue M-300 was injected into the thecal sac, with normal opacification of the nerve roots and cauda equina consistent with free flow within the subarachnoid space. The patient was then moved to the trendelenburg position and contrast flowed into the Thoracic and Cervical spine regions.   I personally performed the lumbar puncture and administered the intrathecal contrast. I also personally performed acquisition of the myelogram  images.   TECHNIQUE: Contiguous axial images were obtained through the Cervical, Thoracic, and Lumbar spine after the intrathecal infusion of infusion. Coronal and sagittal reconstructions were obtained of the axial image sets.   FINDINGS: CERVICAL,THORACIC AND LUMBAR MYELOGRAM FINDINGS:   Scoliotic curvature in the lumbar region convex to the left with the apex at L3-4. S1 is lumbarized. No sign of CSF leak in the lumbar region. Anterior extradural defects at L3-4 and L4-5. Narrowing of the right lateral recess at L3-4 and mild narrowing of both lateral recesses at L4-5.   In the thoracic region, there is minimal curvature convex to the left. Small anterior extradural defects noted in the midthoracic region, without apparent compressive canal stenosis. No sign of CSF leak in the thoracic region at myelography.   In the cervical region, there are anterior extradural defects at C3-4, C4-5, C5-6 and C6-7. Potential for nerve root compression at C5-6 and C6-7. No evidence of CSF leak by myelography.   CT CERVICAL MYELOGRAM FINDINGS:   The scan was begun at the mid cranium in order to assess the sinuses and temporal bones. No fluid or contrast seen in any of the paranasal sinuses or either temporal bone. No evidence of leak along the floor of either middle cranial fossa.   No fluid leak seen within the spinal canal or cervical root sleeves.   No significant degenerative change at C1-2 or C2-3.   C3-4: Spondylosis with endplate osteophytes and bulging of the disc. Indentation of the ventral subarachnoid space but no compression of the cord. Mild foraminal narrowing, not likely compressive.   C4-5: Spondylosis with endplate osteophytes and bulging  of the disc. Narrowing of the ventral subarachnoid space but no compression of the cord. AP diameter of the canal in the midline 8.3 mm. Moderate bilateral bony foraminal narrowing.   C5-6: Spondylosis with endplate osteophytes and  bulging of the disc more prominent towards the right. Effacement of the subarachnoid space with slight deformity of the cord. AP diameter of the canal in the midline 8 mm. Right foraminal stenosis could compress the right C6 nerve.   C6-7: Spondylosis with endplate osteophytes, bulging of the disc and a left posterolateral disc protrusion. Narrowing of the subarachnoid space, AP diameter of the canal in the midline 9.8 mm. Left foraminal stenosis could compress the left C7 nerve.   C7-T1: Normal interspace.   CT LUMBAR MYELOGRAM FINDINGS:   Six non rib-bearing lumbar vertebrae.  S1 is lumbarized.   No sign of CSF leak in the lumbar region. I had some initial concern about the appearance of the root sleeve of the right S1 nerve, there being a somewhat angular appearance at the tip. Repeat scanning was done 25 minutes later and this appears the same, without soft tissue diffusion. None the less, this is a potential source, though not documented to be leaking at the time of the study.   L1-2 and L2-3 are unremarkable.   L3-4: Disc degeneration more pronounced on the right with disc space narrowing and discogenic endplate changes. Mild stenosis of the right lateral recess and intervertebral foramen but without definite neural compression.   L4-5: Shallow protrusion of the disc indents the thecal sac. Mild narrowing of the lateral recesses but no likely neural compression.   L5-S1: Mild bulging of the disc. 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 The L5 inferior facet is markedly enlarged, incorporating the left facet of S1, articulating directly with S2. Posterior elements on the left at S1 or diminutive and do make an articulation with the enlarged left L5 facet. No apparent compressive stenosis of the canal or foramina because of this anomalous anatomy. There are some degenerative changes of this area, and the findings could relate to chronic low back pain.   S1-2:  Mild facet degenerative changes due in part to anomalous anatomy on the left as described above. Mild bulging of the disc. No compressive narrowing of the canal or foramina.   CT THORACIC MYELOGRAM FINDINGS:   Twelve rib-bearing vertebral bodies.   Mild curvature convex to the right. No evidence CSF leak in the central canal or related to any of the thoracic root sleeves. On the left at T12-L1, there is a root sleeve cyst but there is no evidence of leakage in this location.   There are disc protrusions from T6-7 through T10-11 which indent the ventral subarachnoid space but do not compress the cord or show any foraminal encroachment. No significant thoracic region facet arthropathy.   IMPRESSION: Site of CSF leak not demonstrated on what I consider to be a high quality study. There are 2 locations that could be viewed with some suspicion. There is a small root sleeve cyst associated with the nerve root sleeve on the left at T12-L1, but no evidence of leakage at this time. More suspicious, there is an unusual appearance of the end of the root sleeve on the right at S1, with a somewhat angular appearance. This area was restudied 20 minutes after the initial scan, looking to see if there was any interval leakage or soft tissue diffusion, but none was demonstrated at this time. If empirical blood patch is being  considered, I would do it in this region.   Cervical spondylosis from C3-4 through C6-7. No cord compression. Foraminal narrowing in that region as above that could be symptomatic.   Non-compressive disc bulges and protrusions from T6-7 through T10-11.   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 No apparent compressive stenosis in that region because of this however. Findings could relate to mechanical low back pain.   Disc degeneration at L3-4 on the right with mild narrowing of the right  lateral recess and intervertebral foramen, but no distinct neural compression. Shallow disc protrusion at L4-5 with mild narrowing of the lateral recesses, not grossly compressive.     Electronically Signed   By: Paulina Fusi M.D.   On: 11/12/2020 14:57  HPI:  Shelly Myers is a 55 y.o. female here as requested by Dr. Maurice Small for chronic headache. PMHx PCOS, osteopenia, DDD, arthritis. Patient is here alone, has had headaches since she was about 55 years old, evolved into some sort of "irritating", she saw ENT and was cleared, hearing test was fine, when she is upright her ears get a clogged feeling and she cannot hear herself breathe in her head like a head cold, pain, pressure, tried "just about everything", Aimovig, Flexeril, meloxicam, Percocet, Nurtec, Imitrex, Maxalt, amitriptyline, Topamax and has seen neurology off and on. The Christmas before she started having issues with her ears clogging, no inciting events, she went to her doctor and she started prednisone that worked temporarily, symptoms went away with short course of steroids and at some point came back, they tried prednisone again and it did not work, within 2 hours of being supine/upright she gets the symptoms in the ears, turns into a pretty bad pain between her ears like her head is in a vice, when she lays down the symptoms resolve shortly within a few hours. Never wakes up with the head. She has concomitant migraines but those are different than her daily headaches. No other focal neurologic deficits, associated symptoms, inciting events or modifiable factors.  Reviewed notes, labs and imaging from outside physicians, which showed:  MRI brain 04/2020: normal reviewed images MRI cervical spine: : reviewed images 1. Chronic degenerative spondylosis from C3-4 through C6-7. Spinal stenosis with AP diameter of the canal as narrow as 6 mm at C5-6. Effacement of the subarachnoid space and slight indentation of the cord at C4-5 and  C5-6. 2. Foraminal narrowing that could possibly affect the exiting nerve roots bilaterally at C4-5, on the right at C5-6 and on the left at C6-7.   I reviewed Dr. Romana Juniper notes:  She has a 1 year history of ear pain, bilateral, associated with aural fullness, dull, severe in intensity, the only significantly palliating or provoking features that it is resolved with laying flat, she has more global headaches that are migrainous and respond to her migraine regimen but these are not like her other headaches, she saw ENT and there was no explanation, she has known significant cervical disease and was referred to 2 [place] neurosurgery for explanation, she denies any pain in her throat tongue, no numbness in the ears, no pain over the posterior auricular region, no lancinating occipital distribution or facial pain, no radicular pain, no difficulty with fine motor movements in the bilateral upper or lower extremities.  MRI of the cervical spine showed left greater than right foraminal stenosis at C4-C5.  At C5-C6 there is right-sided severe foraminal stenosis.  At  C6-C7 there is a moderate left foraminal stenosis.  There is fairly remarkable significant spondylosis of C3-C4 to C6-C7 with mild to moderate canal stenosis at C4-C5, C5-C6 and C6-C7.  However her aural fullness was not consistent with these findings.  Review of Systems: Patient complains of symptoms per HPI as well as the following symptoms: headache. Pertinent negatives and positives per HPI. All others negative.   Social History   Socioeconomic History   Marital status: Married    Spouse name: Not on file   Number of children: 1   Years of education: Not on file   Highest education level: Not on file  Occupational History   Not on file  Tobacco Use   Smoking status: Never   Smokeless tobacco: Never  Vaping Use   Vaping Use: Never used  Substance and Sexual Activity   Alcohol use: Not Currently    Comment: once or twice a  year around a holiday   Drug use: Never   Sexual activity: Not on file  Other Topics Concern   Not on file  Social History Narrative   Lives at home with husband   Right handed   Caffeine: 3 cups/day   Social Determinants of Health   Financial Resource Strain: Not on file  Food Insecurity: Not on file  Transportation Needs: Not on file  Physical Activity: Not on file  Stress: Not on file  Social Connections: Not on file  Intimate Partner Violence: Not on file    Family History  Problem Relation Age of Onset   Hypertension Mother    Osteopenia Mother    Diabetes Father    Skin cancer Father    Prostate cancer Father    Esophageal cancer Father    Hypertension Father    Hyperlipidemia Father    Headache Neg Hx    Migraines Neg Hx     Past Medical History:  Diagnosis Date   Allergy    Arthritis    DDD (degenerative disc disease), cervical    Osteopenia    PCOS (polycystic ovarian syndrome)     Patient Active Problem List   Diagnosis Date Noted   NDPH (new daily persistent headache) 10/16/2020   Hypertriglyceridemia 07/31/2020   Low bone density 09/20/2019   Osteopenia 07/21/2019   PCOS (polycystic ovarian syndrome) 04/17/2019   Hypervitaminosis D 04/17/2019    Past Surgical History:  Procedure Laterality Date   BREAST ENHANCEMENT SURGERY     HAND SURGERY Bilateral    TONSILLECTOMY      Current Outpatient Medications  Medication Sig Dispense Refill   AIMOVIG 140 MG/ML SOAJ Inject into the skin every 30 (thirty) days.     ALPRAZolam (XANAX) 1 MG tablet Take 2 mg by mouth at bedtime.     buprenorphine (BUTRANS) 10 MCG/HR PTWK 1 patch once a week.     clindamycin (CLEOCIN T) 1 % external solution 3 times/day as needed-between meals & bedtime.      cyclobenzaprine (FLEXERIL) 10 MG tablet TAKE 1/2 TABLET BY MOUTH EVERY MORNING AND AT NOON, AND TAKE 1 TABLET EVERY DAY AT BEDTIME     EPINEPHrine 0.3 mg/0.3 mL IJ SOAJ injection INJECT AS DIRECTED FOR LIFE  THREATENING REACTION     fluticasone (FLONASE) 50 MCG/ACT nasal spray Place 2 sprays into both nostrils as needed.     meloxicam (MOBIC) 15 MG tablet Take 15 mg by mouth daily.     metFORMIN (GLUCOPHAGE-XR) 500 MG 24 hr tablet Take 3 tablets (1,500 mg  total) by mouth daily. 270 tablet 3   nystatin-triamcinolone ointment (MYCOLOG) APPLY TOPICALLY TO THE AFFECTED AREA 2 TIMES A DAY.     oxyCODONE-acetaminophen (PERCOCET) 10-325 MG tablet Take 1 tablet by mouth 2 (two) times daily as needed.     PREMPRO 0.45-1.5 MG tablet Take 1 tablet by mouth daily.     rizatriptan (MAXALT-MLT) 10 MG disintegrating tablet Take 10 mg by mouth daily as needed.     traZODone (DESYREL) 150 MG tablet Take 300 mg by mouth at bedtime.     triamcinolone cream (KENALOG) 0.5 % APPLY TO AFFECTED AREA TWICE DAILY AS NEEDED     TRINTELLIX 20 MG TABS tablet Take 20 mg by mouth daily.     No current facility-administered medications for this visit.    Allergies as of 11/28/2020   (No Known Allergies)    Vitals: BP 123/77 (BP Location: Right Arm, Patient Position: Sitting)   Pulse (!) 102   Ht  (1.549 m)   Wt 104 lb (47.2 kg)   BMI 19.65 kg/m  Last Weight:  Wt Readings from Last 1 Encounters:  11/28/20 104 lb (47.2 kg)   Last Height:   Ht Readings from Last 1 Encounters:  11/28/20  (1.549 m)     Exam: NAD, pleasant                  Speech:    Speech is normal; fluent and spontaneous with normal comprehension.  Cognition:    The patient is oriented to person, place, and time;     recent and remote memory intact;     language fluent;    Cranial Nerves:    The pupils are equal, round, and reactive to light.Trigeminal sensation is intact and the muscles of mastication are normal. The face is symmetric. The palate elevates in the midline. Hearing intact. Voice is normal. Shoulder shrug is normal. The tongue has normal motion without fasciculations.   Coordination:  No dysmetria  Motor  Observation:    No asymmetry, no atrophy, and no involuntary movements noted. Tone:    Normal muscle tone.     Strength:    Strength is V/V in the upper and lower limbs.      Sensation: intact to LT    Assessment/Plan:  Patient with headaches. New daily persistent, with a significant positional quality, starts shortly after getting up in the morning, resolves with laying down. Patient has resorted to laying down all day long with short periods up to do laundry or chores then right back to laying down, significantly affecting her life. Sounds like a low csf headache, csf leak somewhere, she has been treated with multiple medications without relief even the CGRP injections which tells me this is unlikely to be typical or atypical migraine. Discussed with patient in detail, csf leaks are very difficult to find. If she has any drainage from her nose please capture it into the cup we gave her for testing and also completed CT Myelogram C/T/L. The hearing difficulty when standing may be compression of CN VIII when the brain sags during standing; often times we do not see signs of low csf on MRI of the brain either especially with a slow leak.   She is upset, interferring with her life because she has to be laying down all the time or she has a headache.   CT Myelogram did not find a leak however we discussed it is very difficult to find csf leaks.  She also feels the low-pressure is the diagnosis after reading about it. We discussed blood patch, she would like to go forward with it, targeting an s1 nerve sheath where Dr. Karin Golden said looked suspicious. Discussed we may try 2-3 blood patches and if no response we will send to [facility].   I called, Spoke to [place] imaging appointment for blood patch Dr. Karin Golden. July 14th at 9:45 with Dr. Karin Golden.   No orders of the defined types were placed in this encounter.  No orders of the defined types were placed in this encounter.  Discussed: To prevent or  relieve headaches, try the following: Cool Compress. Lie down and place a cool compress on your head.  Avoid headache triggers. If certain foods or odors seem to have triggered your migraines in the past, avoid them. A headache diary might help you identify triggers.  Include physical activity in your daily routine. Try a daily walk or other moderate aerobic exercise.  Manage stress. Find healthy ways to cope with the stressors, such as delegating tasks on your to-do list.  Practice relaxation techniques. Try deep breathing, yoga, massage and visualization.  Eat regularly. Eating regularly scheduled meals and maintaining a healthy diet might help prevent headaches. Also, drink plenty of fluids.  Follow a regular sleep schedule. Sleep deprivation might contribute to headaches Consider biofeedback. With this mind-body technique, you learn to control certain bodily functions -- such as muscle tension, heart rate and blood pressure -- to prevent headaches or reduce headache pain.    Proceed to emergency room if you experience new or worsening symptoms or symptoms do not resolve, if you have new neurologic symptoms or if headache is severe, or for any concerning symptom.   Provided education and documentation from [employer] headache [employer] toolbox including articles on: chronic migraine medication overuse headache, chronic migraines, prevention of migraines, behavioral and other nonpharmacologic treatments for headache.   Cc: Wilfrid Lund, PA,  Wilfrid Lund, [place]  Naomie Dean, MD  [facility] [address] [place], [place] [address]  Phone [phone] Fax [phone]  I spent 30 minutes of face-to-face and non-face-to-face time with patient on the  1. Headache due to low cerebrospinal fluid pressure    diagnosis.  This included previsit chart review, lab review, study review, order entry, electronic health record documentation, patient education on the  different diagnostic and therapeutic options, counseling and coordination of care, risks and benefits of management, compliance, or risk factor reduction

## 2020-12-04 DIAGNOSIS — M25562 Pain in left knee: Secondary | ICD-10-CM | POA: Diagnosis not present

## 2020-12-04 DIAGNOSIS — G894 Chronic pain syndrome: Secondary | ICD-10-CM | POA: Diagnosis not present

## 2020-12-04 DIAGNOSIS — M47812 Spondylosis without myelopathy or radiculopathy, cervical region: Secondary | ICD-10-CM | POA: Diagnosis not present

## 2020-12-04 DIAGNOSIS — G43009 Migraine without aura, not intractable, without status migrainosus: Secondary | ICD-10-CM | POA: Diagnosis not present

## 2020-12-06 DIAGNOSIS — M47816 Spondylosis without myelopathy or radiculopathy, lumbar region: Secondary | ICD-10-CM | POA: Diagnosis not present

## 2020-12-10 DIAGNOSIS — C44519 Basal cell carcinoma of skin of other part of trunk: Secondary | ICD-10-CM | POA: Diagnosis not present

## 2020-12-10 DIAGNOSIS — D485 Neoplasm of uncertain behavior of skin: Secondary | ICD-10-CM | POA: Diagnosis not present

## 2020-12-12 ENCOUNTER — Ambulatory Visit
Admission: RE | Admit: 2020-12-12 | Discharge: 2020-12-12 | Disposition: A | Discharge status: Home / Self Care | Payer: BC Managed Care – PPO | Source: Ambulatory Visit | Attending: Neurology | Admitting: Neurology

## 2020-12-12 ENCOUNTER — Other Ambulatory Visit: Payer: Self-pay

## 2020-12-12 DIAGNOSIS — G9600 Cerebrospinal fluid leak, unspecified: Secondary | ICD-10-CM

## 2020-12-12 DIAGNOSIS — G96810 Intracranial hypotension, unspecified: Secondary | ICD-10-CM | POA: Diagnosis not present

## 2020-12-12 MED ORDER — IOPAMIDOL (ISOVUE-M 200) INJECTION 41%
1.0000 mL | Freq: Once | INTRAMUSCULAR | Status: AC
  Administered 2020-12-12: 1 mL via EPIDURAL

## 2020-12-12 NOTE — Discharge Instructions (Signed)

## 2020-12-23 DIAGNOSIS — F3342 Major depressive disorder, recurrent, in full remission: Secondary | ICD-10-CM | POA: Diagnosis not present

## 2021-01-01 DIAGNOSIS — G894 Chronic pain syndrome: Secondary | ICD-10-CM | POA: Diagnosis not present

## 2021-01-01 DIAGNOSIS — G43009 Migraine without aura, not intractable, without status migrainosus: Secondary | ICD-10-CM | POA: Diagnosis not present

## 2021-01-01 DIAGNOSIS — M47812 Spondylosis without myelopathy or radiculopathy, cervical region: Secondary | ICD-10-CM | POA: Diagnosis not present

## 2021-01-01 DIAGNOSIS — M25562 Pain in left knee: Secondary | ICD-10-CM | POA: Diagnosis not present

## 2021-01-02 DIAGNOSIS — M47816 Spondylosis without myelopathy or radiculopathy, lumbar region: Secondary | ICD-10-CM | POA: Diagnosis not present

## 2021-01-28 DIAGNOSIS — Z79891 Long term (current) use of opiate analgesic: Secondary | ICD-10-CM | POA: Diagnosis not present

## 2021-01-28 DIAGNOSIS — M47812 Spondylosis without myelopathy or radiculopathy, cervical region: Secondary | ICD-10-CM | POA: Diagnosis not present

## 2021-01-28 DIAGNOSIS — G894 Chronic pain syndrome: Secondary | ICD-10-CM | POA: Diagnosis not present

## 2021-01-28 DIAGNOSIS — G43009 Migraine without aura, not intractable, without status migrainosus: Secondary | ICD-10-CM | POA: Diagnosis not present

## 2021-01-28 DIAGNOSIS — M25562 Pain in left knee: Secondary | ICD-10-CM | POA: Diagnosis not present

## 2021-01-30 DIAGNOSIS — M47816 Spondylosis without myelopathy or radiculopathy, lumbar region: Secondary | ICD-10-CM | POA: Diagnosis not present

## 2021-01-30 DIAGNOSIS — R030 Elevated blood-pressure reading, without diagnosis of hypertension: Secondary | ICD-10-CM | POA: Diagnosis not present

## 2021-02-18 DIAGNOSIS — M47816 Spondylosis without myelopathy or radiculopathy, lumbar region: Secondary | ICD-10-CM | POA: Diagnosis not present

## 2021-02-27 DIAGNOSIS — G894 Chronic pain syndrome: Secondary | ICD-10-CM | POA: Diagnosis not present

## 2021-02-27 DIAGNOSIS — M25562 Pain in left knee: Secondary | ICD-10-CM | POA: Diagnosis not present

## 2021-02-27 DIAGNOSIS — M47812 Spondylosis without myelopathy or radiculopathy, cervical region: Secondary | ICD-10-CM | POA: Diagnosis not present

## 2021-02-27 DIAGNOSIS — G43009 Migraine without aura, not intractable, without status migrainosus: Secondary | ICD-10-CM | POA: Diagnosis not present

## 2021-03-13 DIAGNOSIS — R030 Elevated blood-pressure reading, without diagnosis of hypertension: Secondary | ICD-10-CM | POA: Diagnosis not present

## 2021-03-13 DIAGNOSIS — M47816 Spondylosis without myelopathy or radiculopathy, lumbar region: Secondary | ICD-10-CM | POA: Diagnosis not present

## 2021-03-24 DIAGNOSIS — F3342 Major depressive disorder, recurrent, in full remission: Secondary | ICD-10-CM | POA: Diagnosis not present

## 2021-03-25 DIAGNOSIS — G894 Chronic pain syndrome: Secondary | ICD-10-CM | POA: Diagnosis not present

## 2021-03-25 DIAGNOSIS — M25562 Pain in left knee: Secondary | ICD-10-CM | POA: Diagnosis not present

## 2021-03-25 DIAGNOSIS — M47812 Spondylosis without myelopathy or radiculopathy, cervical region: Secondary | ICD-10-CM | POA: Diagnosis not present

## 2021-03-25 DIAGNOSIS — G43009 Migraine without aura, not intractable, without status migrainosus: Secondary | ICD-10-CM | POA: Diagnosis not present

## 2021-03-27 DIAGNOSIS — M47816 Spondylosis without myelopathy or radiculopathy, lumbar region: Secondary | ICD-10-CM | POA: Diagnosis not present

## 2021-03-28 DIAGNOSIS — R002 Palpitations: Secondary | ICD-10-CM | POA: Diagnosis not present

## 2021-03-28 DIAGNOSIS — R000 Tachycardia, unspecified: Secondary | ICD-10-CM | POA: Diagnosis not present

## 2021-03-28 DIAGNOSIS — R42 Dizziness and giddiness: Secondary | ICD-10-CM | POA: Diagnosis not present

## 2021-03-28 DIAGNOSIS — F419 Anxiety disorder, unspecified: Secondary | ICD-10-CM | POA: Diagnosis not present

## 2021-04-03 ENCOUNTER — Other Ambulatory Visit: Payer: Self-pay | Admitting: Obstetrics and Gynecology

## 2021-04-03 DIAGNOSIS — Z79890 Hormone replacement therapy (postmenopausal): Secondary | ICD-10-CM | POA: Diagnosis not present

## 2021-04-03 DIAGNOSIS — N950 Postmenopausal bleeding: Secondary | ICD-10-CM | POA: Diagnosis not present

## 2021-04-03 DIAGNOSIS — Z01419 Encounter for gynecological examination (general) (routine) without abnormal findings: Secondary | ICD-10-CM | POA: Diagnosis not present

## 2021-04-03 DIAGNOSIS — N644 Mastodynia: Secondary | ICD-10-CM | POA: Diagnosis not present

## 2021-04-03 IMAGING — CR DG LUMBAR SPINE COMPLETE 4+V
5 series · 5 of 5 positions shown · non-contrast
Comparison: None.

CLINICAL DATA: Low back pain

EXAM:
LUMBAR SPINE - COMPLETE 4+ VIEW

[t l-spine a.p.]
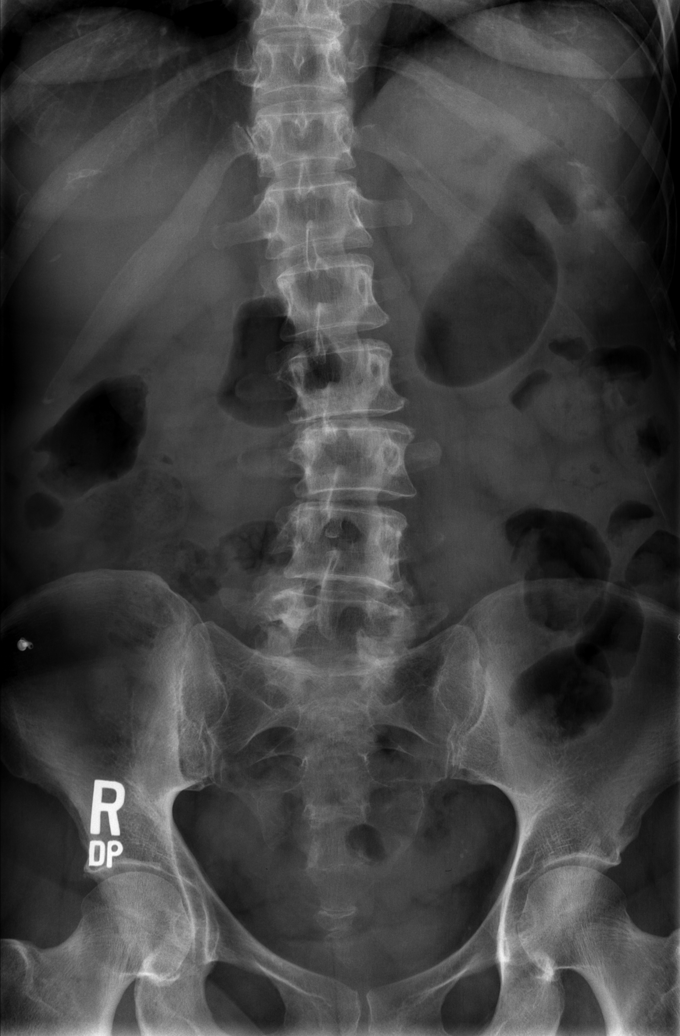

[t l-spine oblique exposure (1 of 2)]
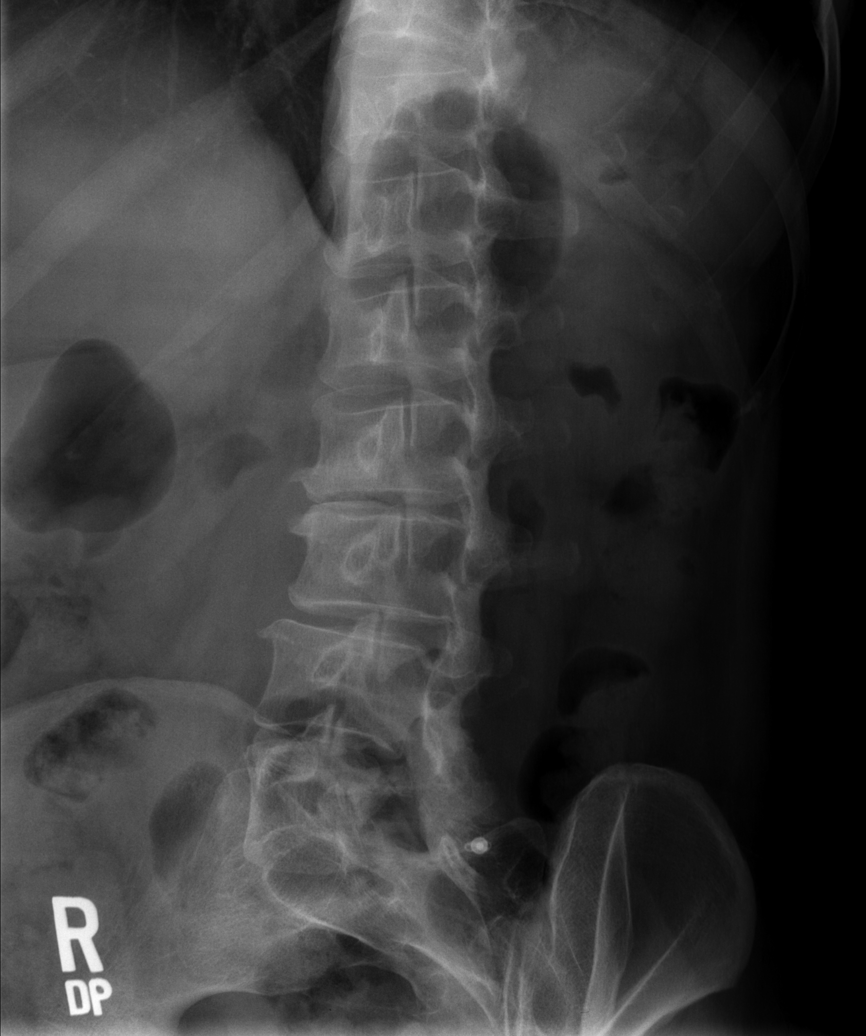

[t l-spine oblique exposure (2 of 2)]
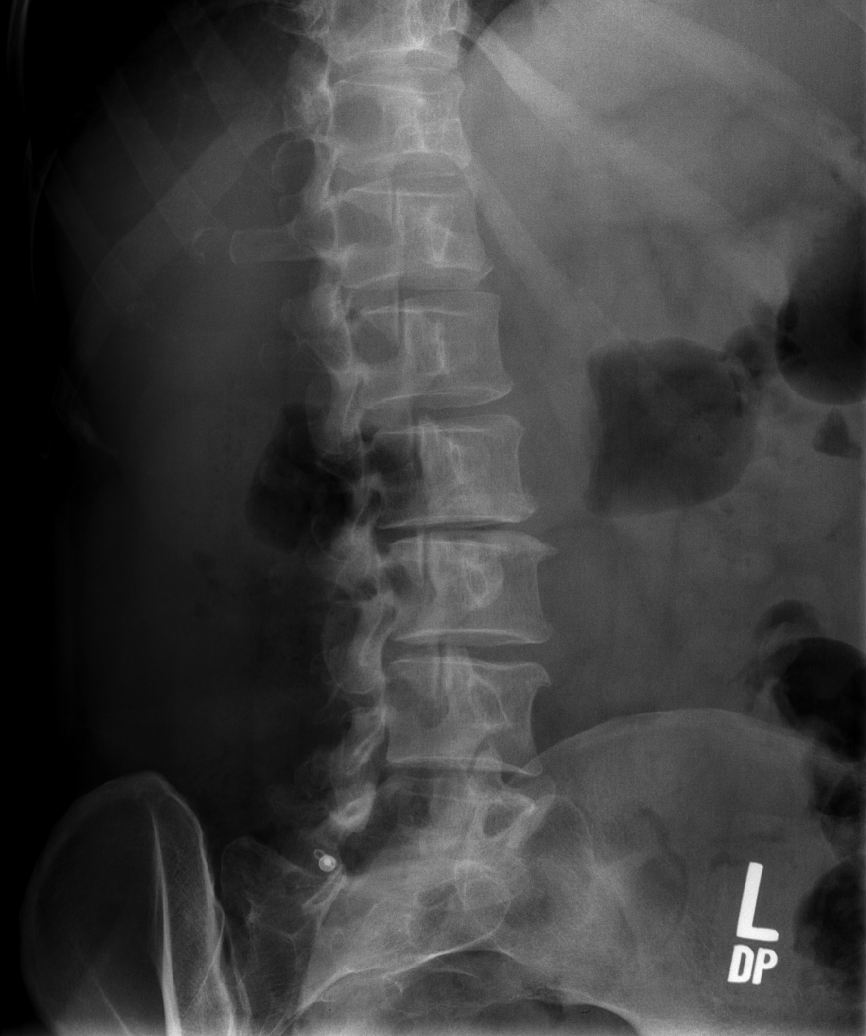

[t l-spine lat]
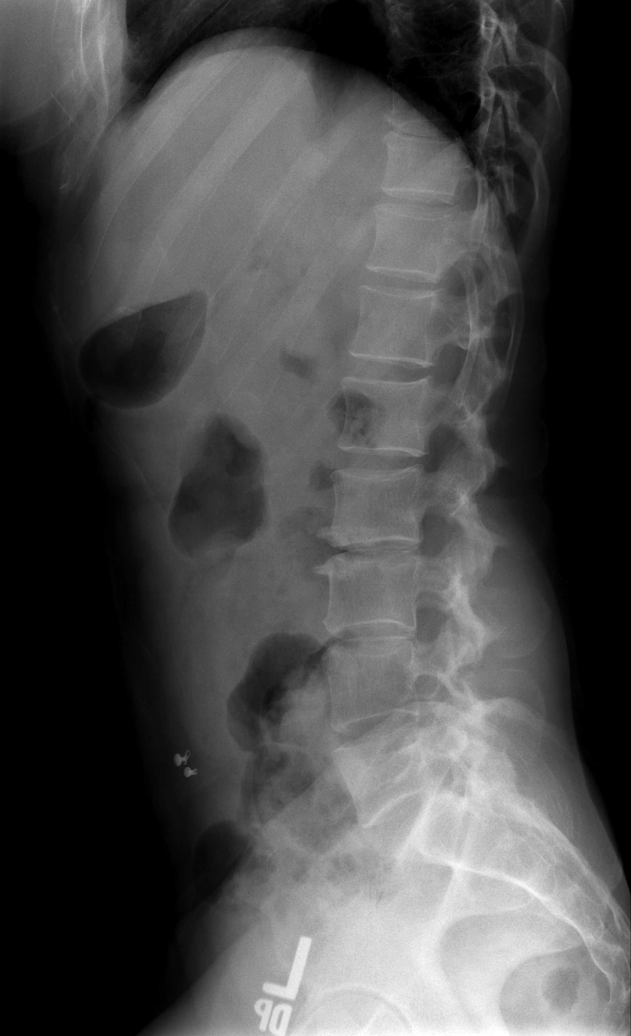

[t l-spine l5-s1 spot]
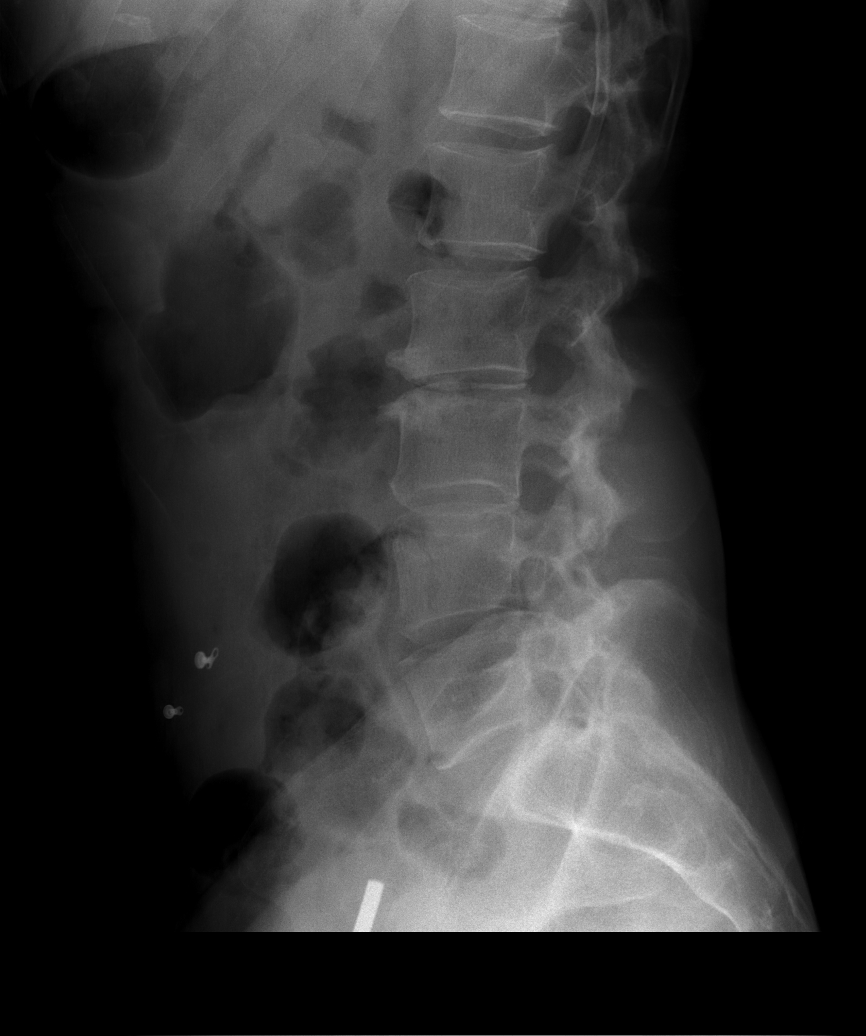

[5 of 5 positions shown; findings below may reference images not displayed]

FINDINGS: Vertebral body height is well maintained. Mild osteophytic changes
are noted at what appears to be L2-3 with associated scoliosis
concave to the right. No pars defects are seen. No anterolisthesis
is noted. Postoperative changes at L 5 are noted on the left.
Correlate with patient's clinical history. No gross soft tissue
abnormality is noted.
IMPRESSION: Degenerative change with scoliosis concave to the right as
described. No acute abnormality noted.

## 2021-04-07 DIAGNOSIS — N950 Postmenopausal bleeding: Secondary | ICD-10-CM | POA: Diagnosis not present

## 2021-04-11 ENCOUNTER — Other Ambulatory Visit: Payer: Self-pay

## 2021-04-11 ENCOUNTER — Other Ambulatory Visit: Payer: Self-pay | Admitting: Obstetrics and Gynecology

## 2021-04-11 DIAGNOSIS — N644 Mastodynia: Secondary | ICD-10-CM

## 2021-04-22 DIAGNOSIS — M25562 Pain in left knee: Secondary | ICD-10-CM | POA: Diagnosis not present

## 2021-04-22 DIAGNOSIS — G894 Chronic pain syndrome: Secondary | ICD-10-CM | POA: Diagnosis not present

## 2021-04-22 DIAGNOSIS — M47812 Spondylosis without myelopathy or radiculopathy, cervical region: Secondary | ICD-10-CM | POA: Diagnosis not present

## 2021-04-22 DIAGNOSIS — G43009 Migraine without aura, not intractable, without status migrainosus: Secondary | ICD-10-CM | POA: Diagnosis not present

## 2021-04-29 DIAGNOSIS — M47816 Spondylosis without myelopathy or radiculopathy, lumbar region: Secondary | ICD-10-CM | POA: Diagnosis not present

## 2021-04-30 DIAGNOSIS — R002 Palpitations: Secondary | ICD-10-CM | POA: Diagnosis not present

## 2021-04-30 DIAGNOSIS — F419 Anxiety disorder, unspecified: Secondary | ICD-10-CM | POA: Diagnosis not present

## 2021-04-30 DIAGNOSIS — R000 Tachycardia, unspecified: Secondary | ICD-10-CM | POA: Diagnosis not present

## 2021-04-30 DIAGNOSIS — R42 Dizziness and giddiness: Secondary | ICD-10-CM | POA: Diagnosis not present

## 2021-05-13 DIAGNOSIS — N950 Postmenopausal bleeding: Secondary | ICD-10-CM | POA: Diagnosis not present

## 2021-05-13 DIAGNOSIS — D219 Benign neoplasm of connective and other soft tissue, unspecified: Secondary | ICD-10-CM | POA: Diagnosis not present

## 2021-05-21 DIAGNOSIS — M47812 Spondylosis without myelopathy or radiculopathy, cervical region: Secondary | ICD-10-CM | POA: Diagnosis not present

## 2021-05-21 DIAGNOSIS — G894 Chronic pain syndrome: Secondary | ICD-10-CM | POA: Diagnosis not present

## 2021-05-21 DIAGNOSIS — M25562 Pain in left knee: Secondary | ICD-10-CM | POA: Diagnosis not present

## 2021-05-21 DIAGNOSIS — G43009 Migraine without aura, not intractable, without status migrainosus: Secondary | ICD-10-CM | POA: Diagnosis not present

## 2021-05-30 ENCOUNTER — Ambulatory Visit
Admission: RE | Admit: 2021-05-30 | Discharge: 2021-05-30 | Disposition: A | Discharge status: Home / Self Care | Payer: BC Managed Care – PPO | Source: Ambulatory Visit | Attending: Obstetrics and Gynecology | Admitting: Obstetrics and Gynecology

## 2021-05-30 ENCOUNTER — Ambulatory Visit: Payer: BC Managed Care – PPO

## 2021-05-30 DIAGNOSIS — N644 Mastodynia: Secondary | ICD-10-CM

## 2021-06-18 DIAGNOSIS — M47812 Spondylosis without myelopathy or radiculopathy, cervical region: Secondary | ICD-10-CM | POA: Diagnosis not present

## 2021-06-18 DIAGNOSIS — M25562 Pain in left knee: Secondary | ICD-10-CM | POA: Diagnosis not present

## 2021-06-18 DIAGNOSIS — G894 Chronic pain syndrome: Secondary | ICD-10-CM | POA: Diagnosis not present

## 2021-06-18 DIAGNOSIS — G43009 Migraine without aura, not intractable, without status migrainosus: Secondary | ICD-10-CM | POA: Diagnosis not present

## 2021-06-25 DIAGNOSIS — R928 Other abnormal and inconclusive findings on diagnostic imaging of breast: Secondary | ICD-10-CM | POA: Diagnosis not present

## 2021-07-16 DIAGNOSIS — M25562 Pain in left knee: Secondary | ICD-10-CM | POA: Diagnosis not present

## 2021-07-16 DIAGNOSIS — G43009 Migraine without aura, not intractable, without status migrainosus: Secondary | ICD-10-CM | POA: Diagnosis not present

## 2021-07-16 DIAGNOSIS — M47812 Spondylosis without myelopathy or radiculopathy, cervical region: Secondary | ICD-10-CM | POA: Diagnosis not present

## 2021-07-16 DIAGNOSIS — G894 Chronic pain syndrome: Secondary | ICD-10-CM | POA: Diagnosis not present

## 2021-07-26 IMAGING — DX DG CERVICAL SPINE 2 OR 3 VIEWS
3 series · 3 of 3 positions shown · non-contrast
Comparison: None.

CLINICAL DATA: Bilateral ear pain radiating into the neck for 4
months.

EXAM:
CERVICAL SPINE - 2-3 VIEW

[dg cervical spine 2 or 3 views (1 of 3)]
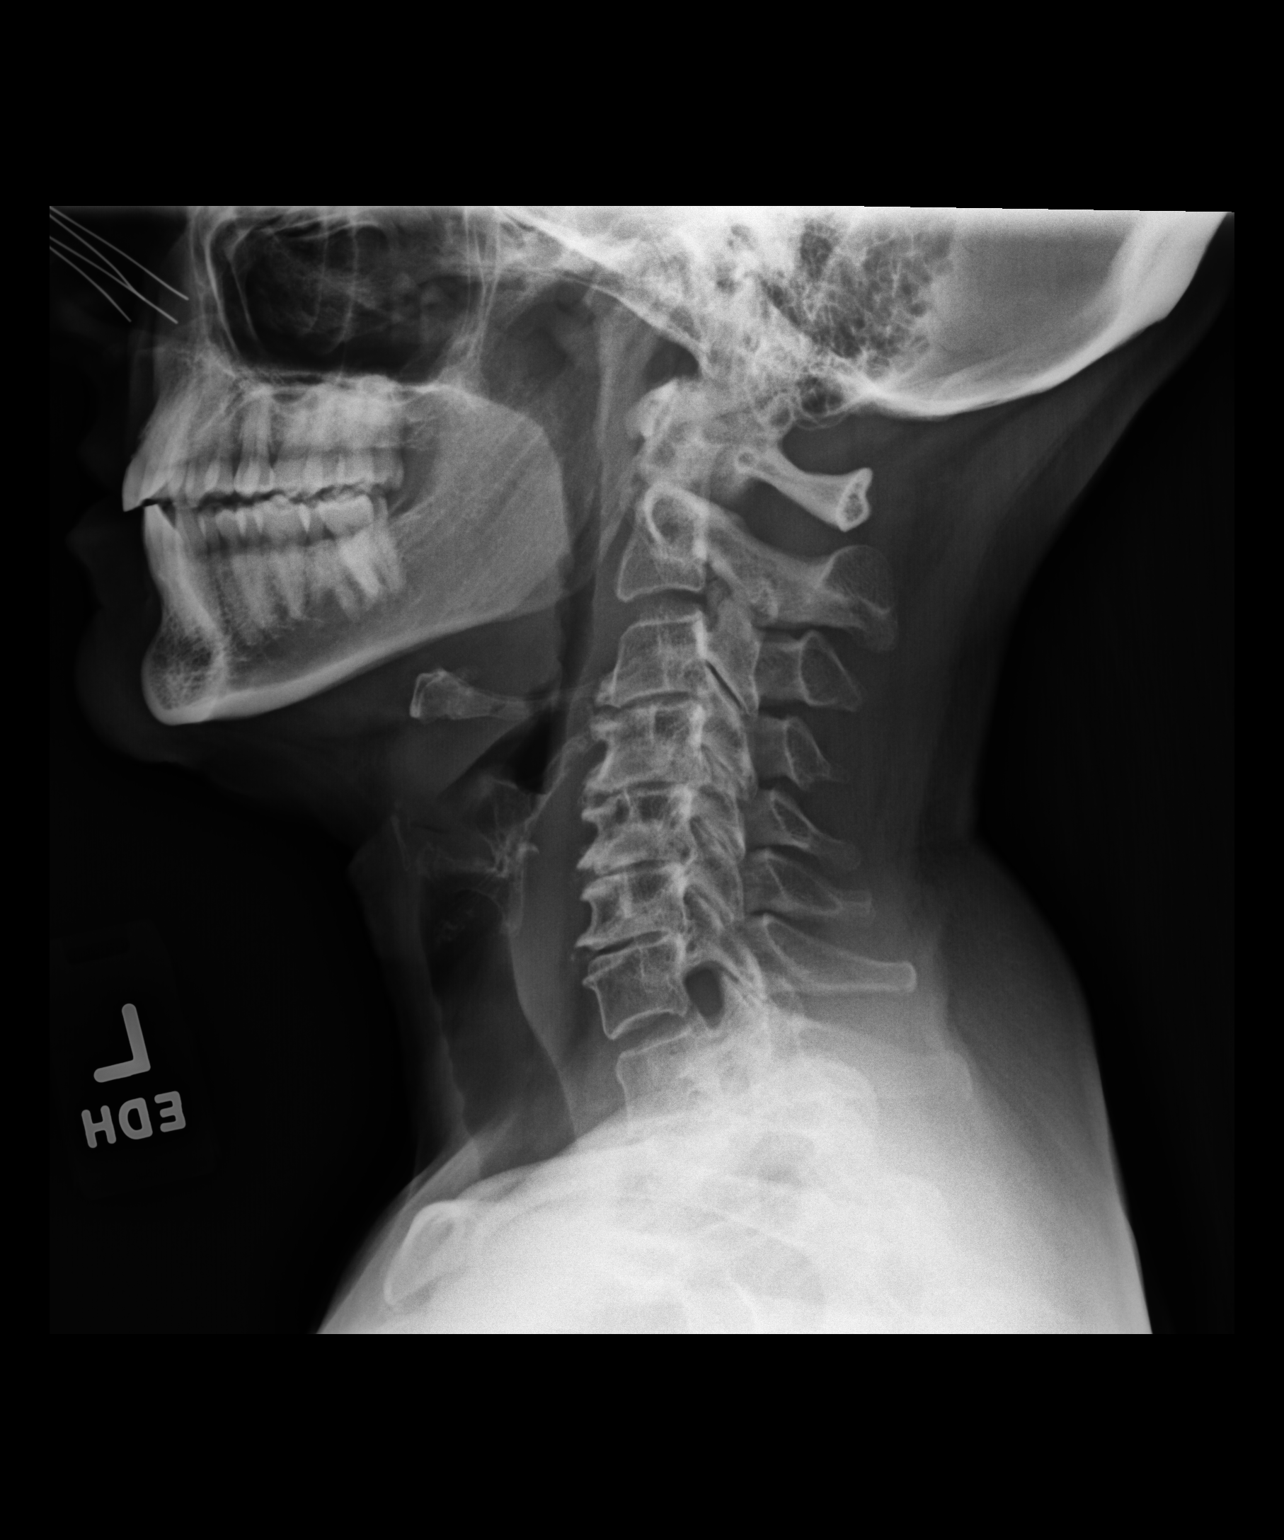

[dg cervical spine 2 or 3 views (2 of 3)]
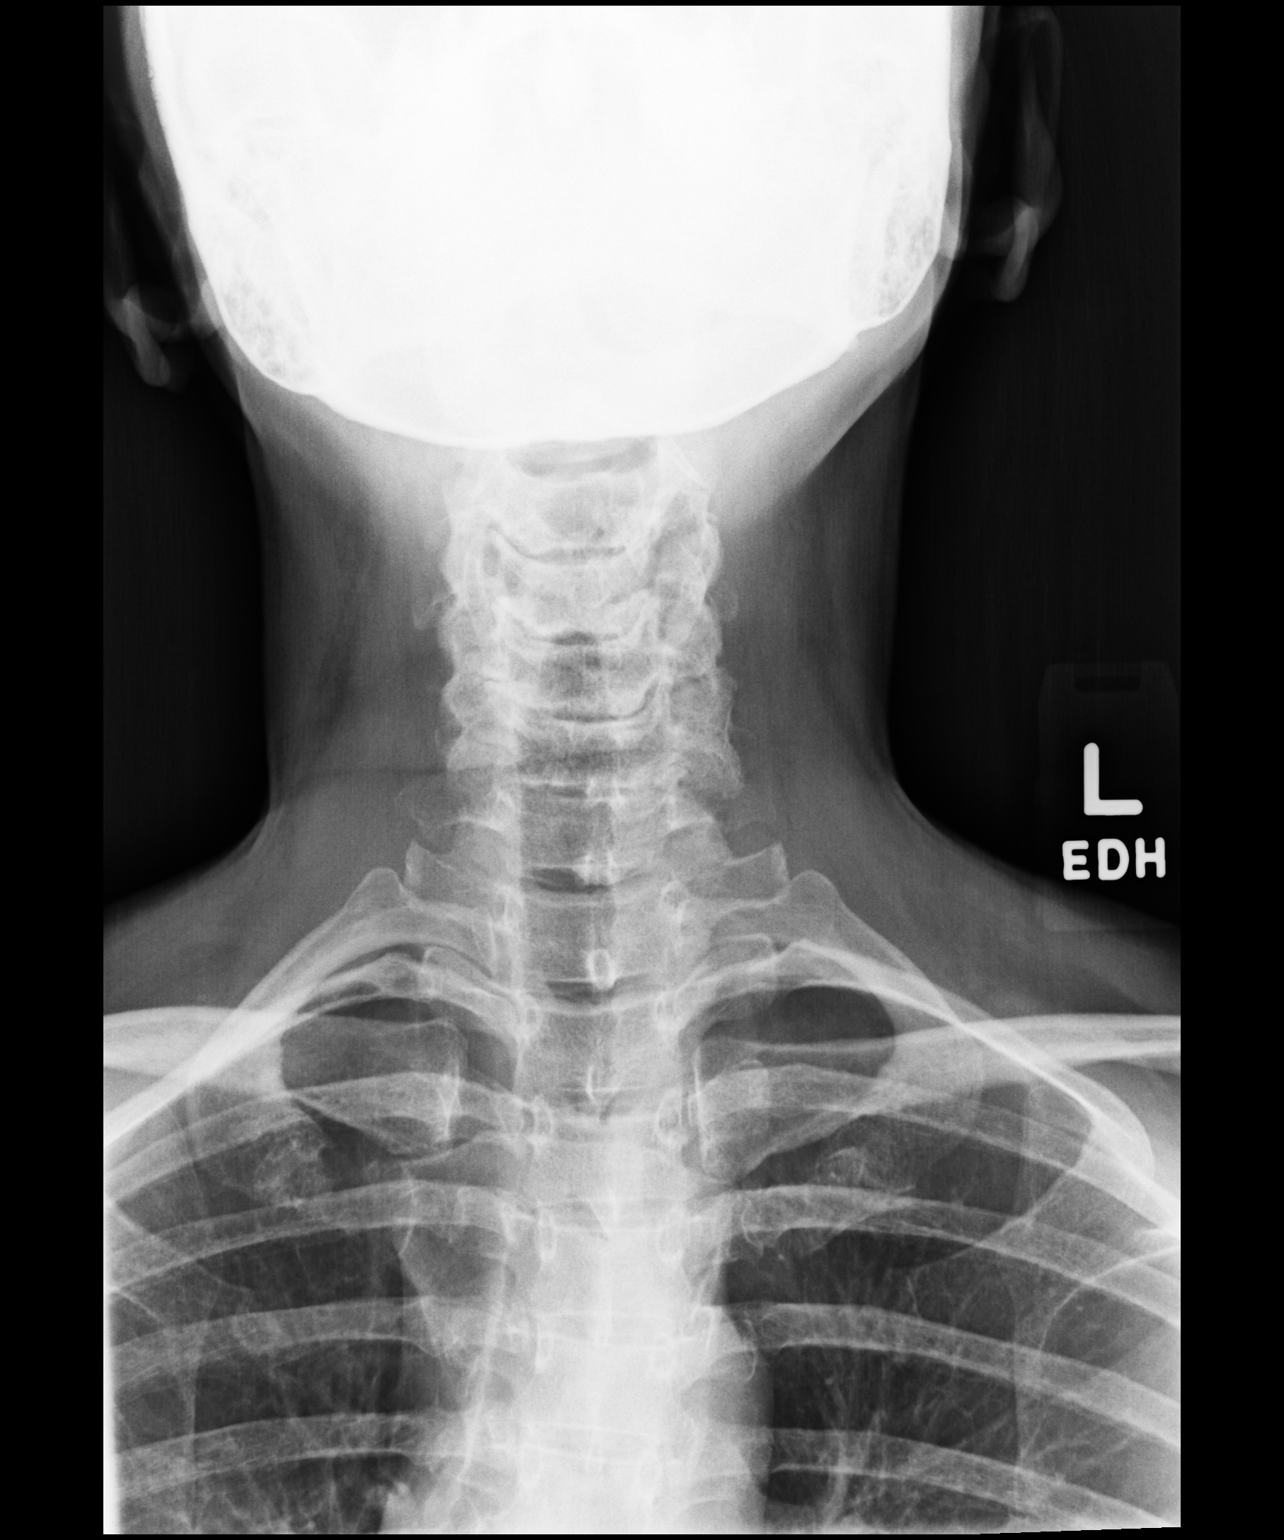

[dg cervical spine 2 or 3 views (3 of 3)]
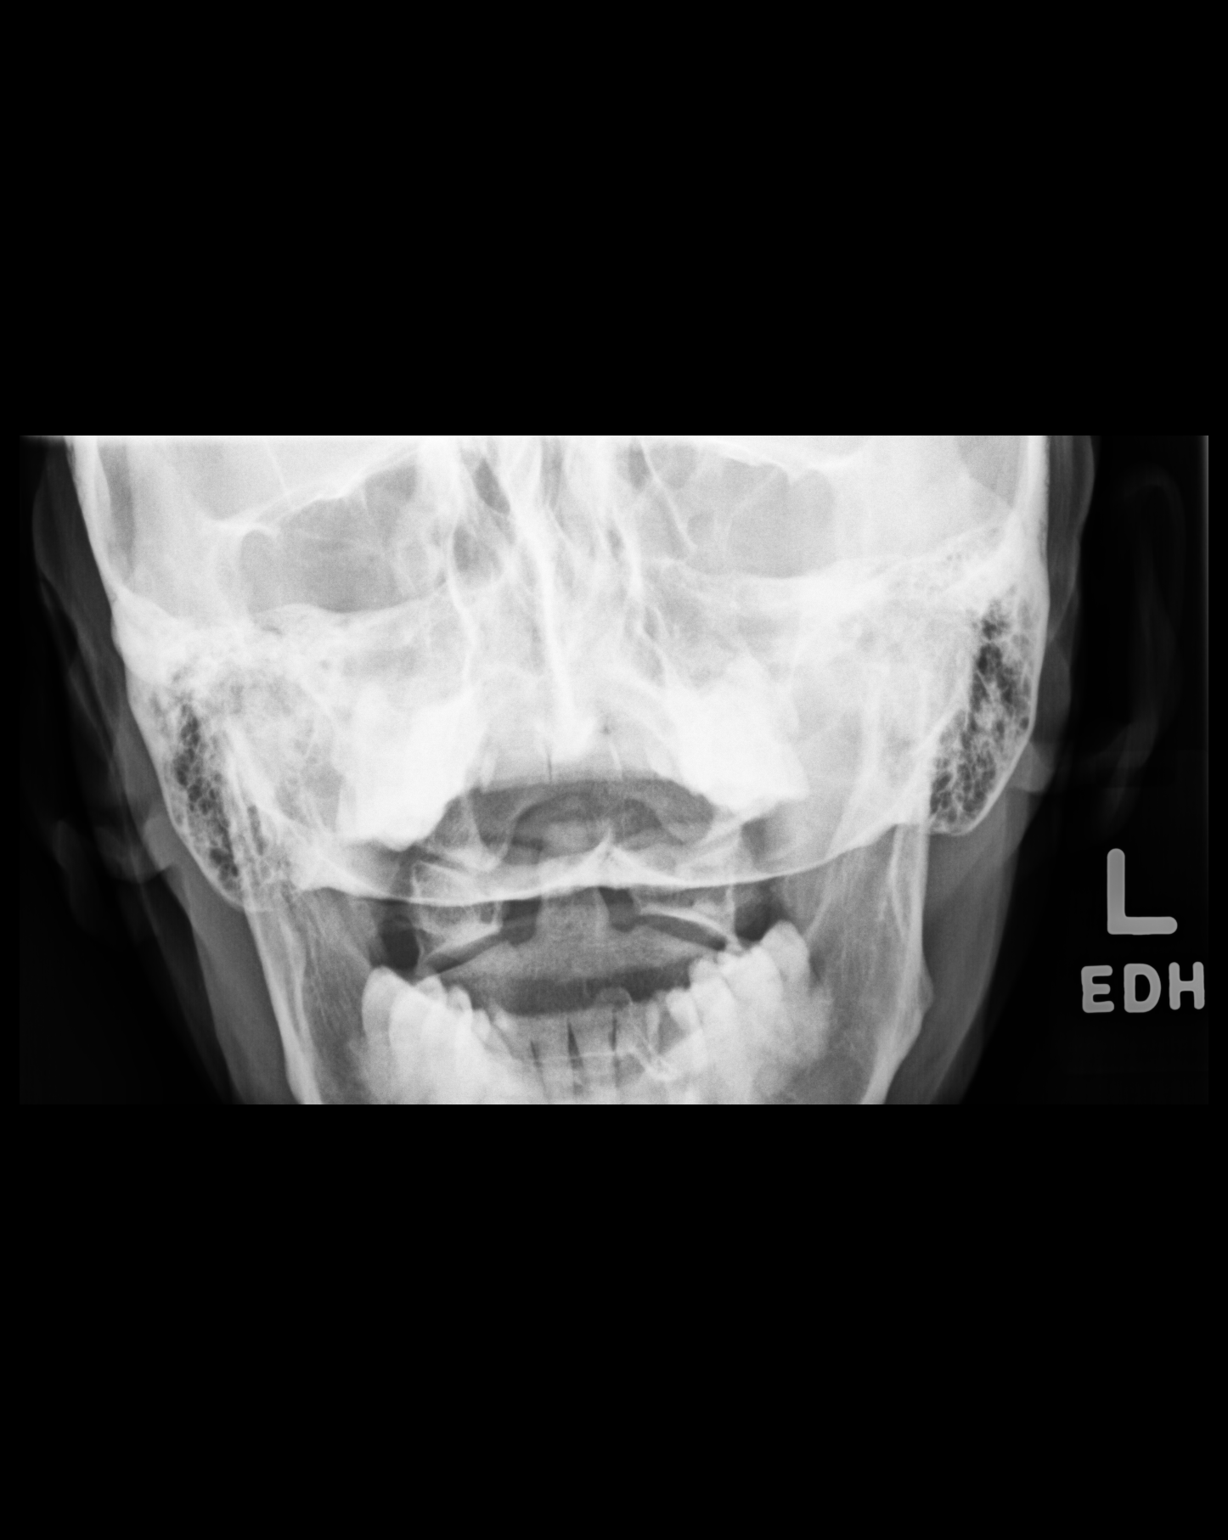

[3 of 3 positions shown; findings below may reference images not displayed]

FINDINGS: There is no fracture. Trace retrolisthesis C4 on C5 is identified.
The patient has severe loss of disc space height with endplate
spurring from C3 to C7. Scattered facet arthropathy is noted.
Prevertebral soft tissues appear normal. The lung apices are clear.
IMPRESSION: Advanced multilevel degenerative disc disease.

## 2021-07-29 DIAGNOSIS — Z01818 Encounter for other preprocedural examination: Secondary | ICD-10-CM | POA: Diagnosis not present

## 2021-07-29 DIAGNOSIS — N950 Postmenopausal bleeding: Secondary | ICD-10-CM | POA: Diagnosis not present

## 2021-07-29 NOTE — H&P (Signed)
Shelly Myers is an 56 y.o. postmenopausal G1P1 who is admitted for Hysteroscopy D&C, possible Myosure for postmenpoausal bleeding.  Has history of PMB in 2021  with benign EMB and unremarkable [place]. She has had return of bleeding and sometimes bleeding lasts up to 1 week. Currently taking HRT (Prempro once daily). Due to recurrence of PMB, patient ultimately desires hysterectomy. Due to previous significant discomfort with outpatient EMB, patient opts for Hysteroscopy D&C under anesthesia.  Work-up: Pap smear (2020): NILM/HRHPV negative  EMB (2021): Proliferative endometrium, mixed with minute fragments of benign endocervical glands, mucus, and blood. No hyperplasia or carcinoma.  TVUS (04/2021):  Uterus 6.84 x 3.31 x 3.76cm Endometrial thickness 0.70cm Fibroid 1: 1.27cm   Fibroid 2: 0.86cm Right ovary 2.11cm   Left ovary 2.05cm Anteverted uterus. Uterine fibroids - anterior to endometrial canal - 1.3 x 1.1 x 1.3cm, posterior to canal 0.8 x 0.5 x 0.9cm. Endometrium slightly thickened. Ovaries - wnl. NO free fluid or adnexal masses seen. Increased amount of bowel gas noted.  Patient Active Problem List   Diagnosis Date Noted   NDPH (new daily persistent headache) 10/16/2020   Hypertriglyceridemia 07/31/2020   Low bone density 09/20/2019   Osteopenia 07/21/2019   PCOS (polycystic ovarian syndrome) 04/17/2019   Hypervitaminosis D 04/17/2019  \  MEDICAL/FAMILY/SOCIAL HX: No LMP recorded. Patient is postmenopausal.    Past Medical History:  Diagnosis Date   Allergy    Arthritis    DDD (degenerative disc disease), cervical    Osteopenia    PCOS (polycystic ovarian syndrome)     Past Surgical History:  Procedure Laterality Date   BREAST ENHANCEMENT SURGERY     HAND SURGERY Bilateral    TONSILLECTOMY      Family History  Problem Relation Age of Onset   Hypertension Mother    Osteopenia Mother    Diabetes Father    Skin cancer Father    Prostate cancer Father    Esophageal  cancer Father    Hypertension Father    Hyperlipidemia Father    Headache Neg Hx    Migraines Neg Hx     Social History:  reports that she has never smoked. She has never used smokeless tobacco. She reports that she does not currently use alcohol. She reports that she does not use drugs.  ALLERGIES/MEDS:  Allergies: No Known Allergies  No medications prior to admission.     Review of Systems  Constitutional: Negative.   HENT: Negative.    Eyes: Negative.   Respiratory: Negative.    Cardiovascular: Negative.   Gastrointestinal: Negative.   Genitourinary: Negative.   Musculoskeletal: Negative.   Skin: Negative.   Neurological: Negative.   Endo/Heme/Allergies: Negative.   Psychiatric/Behavioral: Negative.     There were no vitals taken for this visit. Gen:  NAD, pleasant and cooperative Cardio:  RRR Pulm:  CTAB, no wheezes/rales/rhonchi Abd:  Soft, non-distended, non-tender throughout, no rebound/guarding Ext:  No bilateral LE edema, no bilateral calf tenderness  No results found for this or any previous visit (from the past 24 hour(s)).  No results found.   ASSESSMENT/PLAN: Shelly Myers is a 56 y.o. postmenopausal G1P1 who is admitted for Hysteroscopy D&C, possible Myosure for postmenpoausal bleeding.  - Admit to [facility] - Admit labs (CBC, T&S, COVID screen) - Diet:  ERAS pathway/per anesthesia - IVF:  Per anesthesia - VTE Prophylaxis:  SCDs - D/C home same-day  Consents: I discussed with the patient that this surgery is performed to look inside the uterus and  remove the uterine lining.  Prior to surgery, the risks and benefits of the surgery, as well as alternative treatments, have been discussed.  The risks include, but are not limited to bleeding, including the need for a blood transfusion, infection, damage to organs and tissues, including uterine perforation, requiring additional surgery, postoperative pain, short-term and long-term, failure of the procedure to  control symptoms, need for hysterectomy to control bleeding, fluid overload, which could create electrolyte abnormalities and the need to stop the procedure before completion, inability to safely complete the procedure, deep vein thrombosis and/or pulmonary embolism, painful intercourse, complications the course of which cannot be predicted or prevented, and death.  Patient was consented for blood products.  The patient is aware that bleeding may result in the need for a blood transfusion which includes risk of transmission of HIV (1:2 million), Hepatitis C (1:2 million), and Hepatitis B (1:200 thousand) and transfusion reaction.  Patient voiced understanding of the above risks as well as understanding of indications for blood transfusion.  Steva Ready, DO [phone] (office)

## 2021-07-30 ENCOUNTER — Other Ambulatory Visit: Payer: Self-pay

## 2021-07-30 ENCOUNTER — Encounter: Payer: Self-pay | Admitting: Internal Medicine

## 2021-07-30 ENCOUNTER — Ambulatory Visit: Payer: BC Managed Care – PPO | Admitting: Internal Medicine

## 2021-07-30 VITALS — BP 110/70 | HR 74 | Ht 61.0 in | Wt 104.0 lb

## 2021-07-30 DIAGNOSIS — E673 Hypervitaminosis D: Secondary | ICD-10-CM | POA: Diagnosis not present

## 2021-07-30 DIAGNOSIS — E785 Hyperlipidemia, unspecified: Secondary | ICD-10-CM

## 2021-07-30 DIAGNOSIS — M859 Disorder of bone density and structure, unspecified: Secondary | ICD-10-CM

## 2021-07-30 DIAGNOSIS — E282 Polycystic ovarian syndrome: Secondary | ICD-10-CM

## 2021-07-30 LAB — LIPID PANEL
Cholesterol: 199 mg/dL (ref 0–200)
HDL: 67.6 mg/dL (ref >39.00)
LDL Cholesterol: 103 mg/dL — ABNORMAL HIGH (ref 0–99)
NonHDL: 131.59
Total CHOL/HDL Ratio: 3
Triglycerides: 142 mg/dL (ref 0.0–149.0)
VLDL: 28.4 mg/dL (ref 0.0–40.0)

## 2021-07-30 LAB — TSH: TSH: 0.45 u[IU]/mL (ref 0.35–5.50)

## 2021-07-30 LAB — BASIC METABOLIC PANEL
BUN: 18 mg/dL (ref 6–23)
CO2: 27 mEq/L (ref 19–32)
Calcium: 9.3 mg/dL (ref 8.4–10.5)
Chloride: 102 mEq/L (ref 96–112)
Creatinine, Ser: 1.14 mg/dL (ref 0.40–1.20)
GFR: 53.58 mL/min — ABNORMAL LOW (ref >60.00)
Glucose, Bld: 134 mg/dL — ABNORMAL HIGH (ref 70–99)
Potassium: 4.2 mEq/L (ref 3.5–5.1)
Sodium: 137 mEq/L (ref 135–145)

## 2021-07-30 NOTE — Progress Notes (Signed)
? ?Name: Shelly Myers  ?MRN/ DOB: [id number], 1966/03/23    ?Age/ Sex: 56 y.o., female   ? ? ?PCP: Lois Huxley, PA   ?Reason for Endocrinology Evaluation: Hypervitaminosis D  ?   ?Initial Endocrinology Clinic Visit: 04/17/2019  ? ? ?PATIENT IDENTIFIER: Shelly Myers is a 56 y.o., female with a past medical history of Hx of PCOS, migraine headaches and insomnia. She has followed with [facility] since 04/17/2019 for consultative assistance with management of her hypervitaminosis.  ? ?HISTORICAL SUMMARY:  ?Pt has been referred by Dr. Simona Huh for elevated Vitamin D levels > 120 ng/dL . This was attributed to excessive tanning.  ? ? ?Pt has been diagnosed with PCOS in [place] , was following with Endo there. Has been on Metformin for years.  ?Has 1 child without difficulty conceiving  ? ?DXA 08/14/2019 low bone density - T score -1.1 at the right femoral  neck .Mother with osteopenia / osteoporosis but no hip fracture  ?Menarche at age 55 . Was on OCP's until the age 13 yrs.  ? ?  ?SUBJECTIVE:  ? ? ?Today (07/30/2021):  Shelly Myers is here for a follow up on hypervitaminosis D.  ?Weight stable  ?Has chronic constipation due to opioid use ?Denies polyuria and polydipsia  ?Denies renal stones.  ?She is back to tanning  ?She is not on fish oil  ? ?She is on MVI with vitamin D  ? ?Metformin 500 mg, TID  ? ? ?HISTORY:  ?Past Medical History:  ?Past Medical History:  ?Diagnosis Date  ? Allergy   ? Arthritis   ? DDD (degenerative disc disease), cervical   ? Osteopenia   ? PCOS (polycystic ovarian syndrome)   ? ?Past Surgical History:  ?Social History:  reports that she has never smoked. She has never used smokeless tobacco. She reports that she does not currently use alcohol. She reports that she does not use drugs. ?Family History:  ?Family History  ?Problem Relation Age of Onset  ? Hypertension Mother   ? Osteopenia Mother   ? Diabetes Father   ? Skin cancer Father   ? Prostate cancer Father   ?  Esophageal cancer Father   ? Hypertension Father   ? Hyperlipidemia Father   ? Headache Neg Hx   ? Migraines Neg Hx   ? ? ? ?HOME MEDICATIONS: ?Allergies as of 07/30/2021   ?No Known Allergies ?  ? ?  ?Medication List  ?  ? ?  ? Accurate as of July 30, 2021  1:20 PM. If you have any questions, ask your nurse or doctor.  ?  ?  ? ?  ? ?Aimovig 140 MG/ML Soaj ?Generic drug: Erenumab-aooe ?Inject into the skin every 30 (thirty) days. ?  ?ALPRAZolam 1 MG tablet ?Commonly known as: Duanne Moron ?Take 2 mg by mouth at bedtime. ?  ?buprenorphine 10 MCG/HR Ptwk ?Commonly known as: BUTRANS ?1 patch once a week. ?  ?clindamycin 1 % external solution ?Commonly known as: CLEOCIN T ?3 times/day as needed-between meals & bedtime. ?  ?cyclobenzaprine 10 MG tablet ?Commonly known as: FLEXERIL ?TAKE 1/2 TABLET BY MOUTH EVERY MORNING AND AT NOON, AND TAKE 1 TABLET EVERY DAY AT BEDTIME ?  ?EPINEPHrine 0.3 mg/0.3 mL Soaj injection ?Commonly known as: EPI-PEN ?INJECT AS DIRECTED FOR LIFE THREATENING REACTION ?  ?fluticasone 50 MCG/ACT nasal spray ?Commonly known as: FLONASE ?Place 2 sprays into both nostrils as needed. ?  ?meloxicam 15 MG tablet ?Commonly known as: MOBIC ?Take 15  mg by mouth daily. ?  ?metFORMIN 500 MG 24 hr tablet ?Commonly known as: GLUCOPHAGE-XR ?Take 3 tablets (1,500 mg total) by mouth daily. ?  ?metoprolol tartrate 25 MG tablet ?Commonly known as: LOPRESSOR ?Take 25 mg by mouth 2 (two) times daily. ?  ?nystatin-triamcinolone ointment ?Commonly known as: MYCOLOG ?APPLY TOPICALLY TO THE AFFECTED AREA 2 TIMES A DAY. ?  ?oxyCODONE-acetaminophen 10-325 MG tablet ?Commonly known as: PERCOCET ?Take 1 tablet by mouth 2 (two) times daily as needed. ?  ?Prempro 0.45-1.5 MG tablet ?Generic drug: estrogen (conjugated)-medroxyprogesterone ?Take 1 tablet by mouth daily. ?  ?rizatriptan 10 MG disintegrating tablet ?Commonly known as: MAXALT-MLT ?Take 10 mg by mouth daily as needed. ?  ?traZODone 150 MG tablet ?Commonly known as:  DESYREL ?Take 300 mg by mouth at bedtime. ?  ?triamcinolone cream 0.5 % ?Commonly known as: KENALOG ?APPLY TO AFFECTED AREA TWICE DAILY AS NEEDED ?  ?Trintellix 20 MG Tabs tablet ?Generic drug: vortioxetine HBr ?Take 20 mg by mouth daily. ?  ? ?  ? ? ? ? ?OBJECTIVE:  ? ?PHYSICAL EXAM: ?VS: BP 110/70 (BP Location: Left Arm, Patient Position: Sitting, Cuff Size: Small)   Pulse 74   Ht 5\' 1"  (1.549 m)   Wt 104 lb (47.2 kg)   SpO2 98%   BMI 19.65 kg/m?  ? ? ?EXAM: ?General: Pt appears well and is in NAD  ?Neck: General: Supple without adenopathy. ?Thyroid: Thyroid size normal.  No goiter or nodules appreciated. No thyroid bruit.  ?Lungs: Clear with good BS bilat with no rales, rhonchi, or wheezes  ?Heart: Auscultation: RRR.  ?Abdomen: Normoactive bowel sounds, soft, nontender, without masses or organomegaly palpable  ?Extremities:  ?BL LE: No pretibial edema normal ROM and strength.  ?Skin: Evidence of tanning noted more over the abdomen. Less over the face and hands  ?Mental Status: Judgment, insight: Intact ?Orientation: Oriented to time, place, and person ?Mood and affect: No depression, anxiety, or agitation  ? ? ? ?DATA REVIEWED: ? ? Latest Reference Range & Units 07/30/21 13:25  ?Sodium 135 - 145 mEq/L 137  ?Potassium 3.5 - 5.1 mEq/L 4.2  ?Chloride 96 - 112 mEq/L 102  ?CO2 19 - 32 mEq/L 27  ?Glucose 70 - 99 mg/dL 134 (H)  ?BUN 6 - 23 mg/dL 18  ?Creatinine 0.40 - 1.20 mg/dL 1.14  ?Calcium 8.4 - 10.5 mg/dL 9.3  ?GFR >60.00 mL/min 53.58 (L)  ? ? Latest Reference Range & Units 07/30/21 13:25  ?Total CHOL/HDL Ratio  3  ?Cholesterol 0 - 200 mg/dL 199  ?HDL Cholesterol >39.00 mg/dL 67.60  ?LDL (calc) 0 - 99 mg/dL 103 (H)  ?NonHDL  131.59  ?Triglycerides 0.0 - 149.0 mg/dL 142.0  ?VLDL 0.0 - 40.0 mg/dL 28.4  ?Vitamin D, 25-Hydroxy 30.0 - 100.0 ng/mL 177.2 (H)  ? ? Latest Reference Range & Units 07/30/21 13:25  ?Glucose 70 - 99 mg/dL 134 (H)  ?Hemoglobin A1C 4.8 - 5.6 % 5.1  ?Est. average glucose Bld gHb Est-mCnc mg/dL  100  ?TSH 0.35 - 5.50 uIU/mL 0.45  ? ? ?ASSESSMENT / PLAN / RECOMMENDATIONS:  ? ?1. PCOS :  ? ? ?- A1c normal 5.1 %  ?- Tolerating Metformin  will continue ? ? ?Medication  ?Metformin 500 mg TID  ? ?2.  Low bone density:  ? ?-DXA in 07/2019 showed slight low bone density  ?- Emphasized importance of calcium intake  ?- Will repeat DXA  ? ? ?- She is on MVI daily  ? ? ? ?  3. Dyslipidemia : ? ?- She is not on fish oil  ?- Lipid panel acceptable with normalization of Tg .  ? ? ? ?Medication  ?Fish oil 1000 mg daily  ? ? ?4. Hypervitaminsosis D:  ? ? ?-  Unfortunately this is high again, she is back to tanning.  ?- The pt has been warned about the effects of hypervitaminosis in increasing risk of dehydration, CKD, nephrolithiasis etc  ?- She is not on any OTC vitamin D supplements ?- Other than advising her to stop tanning and complications of hypervitaminosis , I am not sure what else I can do.  ? ? ?Follow-up in 1 yr  ? ? ?Signed electronically by: ?Abby Nena Jordan, MD ? ?[facility]  ?[facility] ?[address] ?[place], [place] [address] ?Phone: [phone] ?[phone]  ? ? ? ? ?CC: ?Lois Huxley, PA ?[place] [address] ?Phone: [phone]  ?Fax: [phone] ? ? ?Return to Endocrinology clinic as below: ?Future Appointments  ?Date Time Provider [place]  ?08/14/2021 11:30 AM [facility] DEXA 1 [facility]  ?  ? ?

## 2021-07-31 LAB — HEMOGLOBIN A1C
Est. average glucose Bld gHb Est-mCnc: 100 mg/dL
Hgb A1c MFr Bld: 5.1 % (ref 4.8–5.6)

## 2021-07-31 LAB — VITAMIN D 25 HYDROXY (VIT D DEFICIENCY, FRACTURES): Vit D, 25-Hydroxy: 177.2 ng/mL — ABNORMAL HIGH (ref 30.0–100.0)

## 2021-08-01 ENCOUNTER — Telehealth: Payer: Self-pay | Admitting: Internal Medicine

## 2021-08-01 NOTE — Telephone Encounter (Signed)
Shelly Myers,  ? ?Please let the pt know that her vitamin D is EXTREMELY HIgh at 177 , need to be below 100 . Her kidney function is low , and having such high vitamin D is going to exacerbate worsening of kidney function  ? ? ?She needs to seriously STOP ALL tanning  ? ?Cholesterol and thyroid functions as well as A1c are normal  ? ? ?Thanks  ?

## 2021-08-01 NOTE — Telephone Encounter (Signed)
Vm left for patient to call back.

## 2021-08-01 NOTE — Telephone Encounter (Signed)
Patient notified and verbalized understanding. 

## 2021-08-05 ENCOUNTER — Other Ambulatory Visit: Payer: Self-pay | Admitting: Obstetrics and Gynecology

## 2021-08-05 DIAGNOSIS — N644 Mastodynia: Secondary | ICD-10-CM

## 2021-08-06 ENCOUNTER — Encounter (HOSPITAL_BASED_OUTPATIENT_CLINIC_OR_DEPARTMENT_OTHER): Payer: Self-pay | Admitting: Obstetrics and Gynecology

## 2021-08-06 ENCOUNTER — Other Ambulatory Visit: Payer: Self-pay

## 2021-08-11 ENCOUNTER — Encounter (HOSPITAL_BASED_OUTPATIENT_CLINIC_OR_DEPARTMENT_OTHER)
Admission: RE | Admit: 2021-08-11 | Discharge: 2021-08-11 | Disposition: A | Discharge status: Home / Self Care | Payer: BC Managed Care – PPO | Source: Ambulatory Visit | Attending: Obstetrics and Gynecology | Admitting: Obstetrics and Gynecology

## 2021-08-11 DIAGNOSIS — G43009 Migraine without aura, not intractable, without status migrainosus: Secondary | ICD-10-CM | POA: Diagnosis not present

## 2021-08-11 DIAGNOSIS — G894 Chronic pain syndrome: Secondary | ICD-10-CM | POA: Diagnosis not present

## 2021-08-11 DIAGNOSIS — R9389 Abnormal findings on diagnostic imaging of other specified body structures: Secondary | ICD-10-CM | POA: Diagnosis not present

## 2021-08-11 DIAGNOSIS — Z79890 Hormone replacement therapy (postmenopausal): Secondary | ICD-10-CM | POA: Diagnosis not present

## 2021-08-11 DIAGNOSIS — N840 Polyp of corpus uteri: Secondary | ICD-10-CM | POA: Diagnosis not present

## 2021-08-11 DIAGNOSIS — M25562 Pain in left knee: Secondary | ICD-10-CM | POA: Diagnosis not present

## 2021-08-11 DIAGNOSIS — Z01812 Encounter for preprocedural laboratory examination: Secondary | ICD-10-CM | POA: Insufficient documentation

## 2021-08-11 DIAGNOSIS — I10 Essential (primary) hypertension: Secondary | ICD-10-CM | POA: Diagnosis not present

## 2021-08-11 DIAGNOSIS — M47812 Spondylosis without myelopathy or radiculopathy, cervical region: Secondary | ICD-10-CM | POA: Diagnosis not present

## 2021-08-11 DIAGNOSIS — N950 Postmenopausal bleeding: Secondary | ICD-10-CM | POA: Diagnosis not present

## 2021-08-11 LAB — TYPE AND SCREEN
ABO/RH(D): O POS
Antibody Screen: NEGATIVE

## 2021-08-11 LAB — CBC
HCT: 35.7 % — ABNORMAL LOW (ref 36.0–46.0)
Hemoglobin: 12.3 g/dL (ref 12.0–15.0)
MCH: 33 pg (ref 26.0–34.0)
MCHC: 34.5 g/dL (ref 30.0–36.0)
MCV: 95.7 fL (ref 80.0–100.0)
Platelets: 485 10*3/uL — ABNORMAL HIGH (ref 150–400)
RBC: 3.73 MIL/uL — ABNORMAL LOW (ref 3.87–5.11)
RDW: 11.8 % (ref 11.5–15.5)
WBC: 7.3 10*3/uL (ref 4.0–10.5)
nRBC: 0 % (ref 0.0–0.2)

## 2021-08-11 NOTE — Progress Notes (Signed)

## 2021-08-13 ENCOUNTER — Ambulatory Visit (HOSPITAL_BASED_OUTPATIENT_CLINIC_OR_DEPARTMENT_OTHER): Payer: BC Managed Care – PPO | Admitting: Anesthesiology

## 2021-08-13 ENCOUNTER — Encounter (HOSPITAL_BASED_OUTPATIENT_CLINIC_OR_DEPARTMENT_OTHER)
Admission: RE | Disposition: A | Discharge status: Home / Self Care | Payer: Self-pay | Source: Home / Self Care | Attending: Obstetrics and Gynecology

## 2021-08-13 ENCOUNTER — Other Ambulatory Visit: Payer: Self-pay

## 2021-08-13 ENCOUNTER — Ambulatory Visit (HOSPITAL_BASED_OUTPATIENT_CLINIC_OR_DEPARTMENT_OTHER)
Admission: RE | Admit: 2021-08-13 | Discharge: 2021-08-13 | Disposition: A | Discharge status: Home / Self Care | Payer: BC Managed Care – PPO | Attending: Obstetrics and Gynecology | Admitting: Obstetrics and Gynecology

## 2021-08-13 ENCOUNTER — Encounter (HOSPITAL_BASED_OUTPATIENT_CLINIC_OR_DEPARTMENT_OTHER): Payer: Self-pay | Admitting: Obstetrics and Gynecology

## 2021-08-13 DIAGNOSIS — I10 Essential (primary) hypertension: Secondary | ICD-10-CM | POA: Insufficient documentation

## 2021-08-13 DIAGNOSIS — Z79890 Hormone replacement therapy (postmenopausal): Secondary | ICD-10-CM | POA: Insufficient documentation

## 2021-08-13 DIAGNOSIS — N840 Polyp of corpus uteri: Secondary | ICD-10-CM | POA: Insufficient documentation

## 2021-08-13 DIAGNOSIS — R9389 Abnormal findings on diagnostic imaging of other specified body structures: Secondary | ICD-10-CM | POA: Diagnosis not present

## 2021-08-13 DIAGNOSIS — N950 Postmenopausal bleeding: Secondary | ICD-10-CM | POA: Diagnosis not present

## 2021-08-13 HISTORY — DX: Essential (primary) hypertension: I10

## 2021-08-13 SURGERY — DILATATION & CURETTAGE/HYSTEROSCOPY WITH MYOSURE
Anesthesia: General | Site: Vagina

## 2021-08-13 MED ORDER — KETOROLAC TROMETHAMINE 30 MG/ML IJ SOLN
INTRAMUSCULAR | Status: DC | PRN
Start: 2021-08-13 — End: 2021-08-13
  Administered 2021-08-13: 20 mg via INTRAVENOUS

## 2021-08-13 MED ORDER — DEXAMETHASONE SODIUM PHOSPHATE 10 MG/ML IJ SOLN
INTRAMUSCULAR | Status: DC | PRN
  Administered 2021-08-13: 10 mg via INTRAVENOUS

## 2021-08-13 MED ORDER — FENTANYL CITRATE (PF) 100 MCG/2ML IJ SOLN
INTRAMUSCULAR | Status: DC | PRN
  Administered 2021-08-13: 25 ug via INTRAVENOUS

## 2021-08-13 MED ORDER — SILVER NITRATE-POT NITRATE 75-25 % EX MISC
CUTANEOUS | Status: DC | PRN
  Administered 2021-08-13: 2

## 2021-08-13 MED ORDER — SILVER NITRATE-POT NITRATE 75-25 % EX MISC
CUTANEOUS | Status: AC
  Filled 2021-08-13: qty 10

## 2021-08-13 MED ORDER — IBUPROFEN 800 MG PO TABS
800.0000 mg | ORAL_TABLET | Freq: Three times a day (TID) | ORAL | 0 refills | Status: DC | PRN
Start: 2021-08-13 — End: 2022-10-07

## 2021-08-13 MED ORDER — LIDOCAINE HCL (CARDIAC) PF 100 MG/5ML IV SOSY
PREFILLED_SYRINGE | INTRAVENOUS | Status: DC | PRN
  Administered 2021-08-13: 60 mg via INTRAVENOUS

## 2021-08-13 MED ORDER — ONDANSETRON HCL 4 MG/2ML IJ SOLN
INTRAMUSCULAR | Status: AC
  Filled 2021-08-13: qty 2

## 2021-08-13 MED ORDER — DEXAMETHASONE SODIUM PHOSPHATE 10 MG/ML IJ SOLN
INTRAMUSCULAR | Status: AC
  Filled 2021-08-13: qty 1

## 2021-08-13 MED ORDER — FENTANYL CITRATE (PF) 100 MCG/2ML IJ SOLN
INTRAMUSCULAR | Status: AC
  Filled 2021-08-13: qty 2

## 2021-08-13 MED ORDER — MIDAZOLAM HCL 5 MG/5ML IJ SOLN
INTRAMUSCULAR | Status: DC | PRN
  Administered 2021-08-13: 2 mg via INTRAVENOUS

## 2021-08-13 MED ORDER — PROPOFOL 10 MG/ML IV BOLUS
INTRAVENOUS | Status: DC | PRN
  Administered 2021-08-13: 200 mg via INTRAVENOUS

## 2021-08-13 MED ORDER — OXYCODONE HCL 5 MG PO TABS
5.0000 mg | ORAL_TABLET | Freq: Once | ORAL | Status: AC | PRN
  Administered 2021-08-13: 5 mg via ORAL

## 2021-08-13 MED ORDER — OXYCODONE HCL 5 MG PO TABS
ORAL_TABLET | ORAL | Status: AC
  Filled 2021-08-13: qty 1

## 2021-08-13 MED ORDER — LIDOCAINE 2% (20 MG/ML) 5 ML SYRINGE
INTRAMUSCULAR | Status: AC
  Filled 2021-08-13: qty 5

## 2021-08-13 MED ORDER — KETOROLAC TROMETHAMINE 30 MG/ML IJ SOLN: 15.0000 mg | Freq: Once | INTRAMUSCULAR | Status: DC | PRN

## 2021-08-13 MED ORDER — ACETAMINOPHEN 500 MG PO TABS
1000.0000 mg | ORAL_TABLET | Freq: Once | ORAL | Status: AC
Start: 2021-08-13 — End: 2021-08-13
  Administered 2021-08-13: 1000 mg via ORAL

## 2021-08-13 MED ORDER — MIDAZOLAM HCL 2 MG/2ML IJ SOLN
INTRAMUSCULAR | Status: AC
  Filled 2021-08-13: qty 2

## 2021-08-13 MED ORDER — OXYCODONE HCL 5 MG/5ML PO SOLN: 5.0000 mg | Freq: Once | ORAL | Status: AC | PRN

## 2021-08-13 MED ORDER — LACTATED RINGERS IV SOLN: INTRAVENOUS | Status: DC

## 2021-08-13 MED ORDER — AMISULPRIDE (ANTIEMETIC) 5 MG/2ML IV SOLN: 10.0000 mg | Freq: Once | INTRAVENOUS | Status: DC | PRN

## 2021-08-13 MED ORDER — ACETAMINOPHEN 500 MG PO TABS
ORAL_TABLET | ORAL | Status: AC
  Filled 2021-08-13: qty 2

## 2021-08-13 MED ORDER — FENTANYL CITRATE (PF) 100 MCG/2ML IJ SOLN
INTRAMUSCULAR | Status: AC
Start: 2021-08-13 — End: ?
  Filled 2021-08-13: qty 2

## 2021-08-13 MED ORDER — PROPOFOL 10 MG/ML IV BOLUS
INTRAVENOUS | Status: AC
  Filled 2021-08-13: qty 20

## 2021-08-13 MED ORDER — KETOROLAC TROMETHAMINE 30 MG/ML IJ SOLN
INTRAMUSCULAR | Status: AC
  Filled 2021-08-13: qty 1

## 2021-08-13 MED ORDER — ONDANSETRON HCL 4 MG/2ML IJ SOLN
INTRAMUSCULAR | Status: DC | PRN
  Administered 2021-08-13: 4 mg via INTRAVENOUS

## 2021-08-13 MED ORDER — FENTANYL CITRATE (PF) 100 MCG/2ML IJ SOLN
25.0000 ug | INTRAMUSCULAR | Status: DC | PRN
  Administered 2021-08-13 (×2): 50 ug via INTRAVENOUS

## 2021-08-13 SURGICAL SUPPLY — 21 items
CANISTER SUCT 1200ML W/VALVE (MISCELLANEOUS) ×2 IMPLANT
DEVICE MYOSURE LITE (MISCELLANEOUS) IMPLANT
DEVICE MYOSURE REACH (MISCELLANEOUS) ×1 IMPLANT
DILATOR CANAL MILEX (MISCELLANEOUS) IMPLANT
GAUZE 4X4 16PLY ~~LOC~~+RFID DBL (SPONGE) ×2 IMPLANT
GLOVE SURG ENC MOIS LTX SZ6.5 (GLOVE) ×2 IMPLANT
GLOVE SURG ENC TEXT LTX SZ6.5 (GLOVE) ×2 IMPLANT
GLOVE SURG POLYISO LF SZ6.5 (GLOVE) ×1 IMPLANT
GLOVE SURG UNDER POLY LF SZ7 (GLOVE) ×3 IMPLANT
GOWN STRL REUS W/ TWL LRG LVL3 (GOWN DISPOSABLE) ×2 IMPLANT
GOWN STRL REUS W/ TWL XL LVL3 (GOWN DISPOSABLE) IMPLANT
GOWN STRL REUS W/TWL LRG LVL3 (GOWN DISPOSABLE) ×4
GOWN STRL REUS W/TWL XL LVL3 (GOWN DISPOSABLE) ×2
KIT PROCEDURE FLUENT (KITS) ×2 IMPLANT
KIT TURNOVER KIT B (KITS) ×2 IMPLANT
PACK VAGINAL MINOR WOMEN LF (CUSTOM PROCEDURE TRAY) ×2 IMPLANT
PAD OB MATERNITY 4.3X12.25 (PERSONAL CARE ITEMS) ×2 IMPLANT
SEAL ROD LENS SCOPE MYOSURE (ABLATOR) ×2 IMPLANT
SLEEVE SCD COMPRESS KNEE MED (STOCKING) ×2 IMPLANT
TOWEL GREEN STERILE FF (TOWEL DISPOSABLE) ×4 IMPLANT
UNDERPAD 30X36 HEAVY ABSORB (UNDERPADS AND DIAPERS) ×2 IMPLANT

## 2021-08-13 NOTE — Transfer of Care (Signed)
Immediate Anesthesia Transfer of Care Note ? ?Patient: Shelly Myers ? ?Procedure(s) Performed: DILATATION & CURETTAGE/HYSTEROSCOPY WITH MYOSURE (Vagina ) ? ?Patient Location: PACU ? ?Anesthesia Type:General ? ?Level of Consciousness: awake, alert  and oriented ? ?Airway & Oxygen Therapy: Patient Spontanous Breathing and Patient connected to face mask oxygen ? ?Post-op Assessment: Report given to RN and Post -op Vital signs reviewed and stable ? ?Post vital signs: Reviewed and stable ? ?Last Vitals:  ? ?BP: 116/89 (99) ?HR: 85 ?SpO2: 100 ?RR: 18 ?Vitals Value Taken Time  ?BP    ?Temp    ?Pulse    ?Resp    ?SpO2    ? ? ?Last Pain:  ?Vitals:  ? 08/13/21 1059  ?TempSrc: Oral  ?PainSc: 4   ?   ? ?  ? ?Complications: No notable events documented. ?

## 2021-08-13 NOTE — Anesthesia Procedure Notes (Signed)
Procedure Name: LMA Insertion ?Date/Time: 08/13/2021 12:35 PM ?Performed by: Lauralyn Primes, CRNA ?Pre-anesthesia Checklist: Patient identified, Emergency Drugs available, Suction available and Patient being monitored ?Patient Re-evaluated:Patient Re-evaluated prior to induction ?Oxygen Delivery Method: Circle system utilized ?Preoxygenation: Pre-oxygenation with 100% oxygen ?Induction Type: IV induction ?Ventilation: Mask ventilation without difficulty ?LMA: LMA inserted ?LMA Size: 4.0 ?Number of attempts: 1 ?Airway Equipment and Method: Bite block ?Placement Confirmation: positive ETCO2 ?Tube secured with: Tape ?Dental Injury: Teeth and Oropharynx as per pre-operative assessment  ? ? ? ? ?

## 2021-08-13 NOTE — Anesthesia Postprocedure Evaluation (Signed)
Anesthesia Post Note ? ?Patient: Sunni Richardson ? ?Procedure(s) Performed: DILATATION & CURETTAGE/HYSTEROSCOPY WITH MYOSURE (Vagina ) ? ?  ? ?Patient location during evaluation: PACU ?Anesthesia Type: General ?Level of consciousness: awake ?Pain management: pain level controlled ?Vital Signs Assessment: post-procedure vital signs reviewed and stable ?Respiratory status: spontaneous breathing, nonlabored ventilation, respiratory function stable and patient connected to nasal cannula oxygen ?Cardiovascular status: blood pressure returned to baseline and stable ?Postop Assessment: no apparent nausea or vomiting ?Anesthetic complications: no ? ? ?No notable events documented. ? ?Last Vitals:  ?Vitals:  ? 08/13/21 1330 08/13/21 1402  ?BP: 112/80 111/77  ?Pulse: 85 88  ?Resp: 12 16  ?Temp:  37.4 ?C  ?SpO2: 96% 97%  ?  ?Last Pain:  ?Vitals:  ? 08/13/21 1402  ?TempSrc: Oral  ?PainSc: 3   ? ? ?  ?  ?  ?  ?  ?  ? ?Courtni Balash P Areen Trautner ? ? ? ? ?

## 2021-08-13 NOTE — Anesthesia Preprocedure Evaluation (Addendum)
Anesthesia Evaluation  ?Patient identified by MRN, date of birth, ID band ?Patient awake ? ? ? ?Reviewed: ?Allergy & Precautions, NPO status , Patient's Chart, lab work & pertinent test results ? ?Airway ?Mallampati: II ? ?TM Distance: >3 FB ?Neck ROM: Full ? ? ? Dental ?no notable dental hx. ? ?  ?Pulmonary ?neg pulmonary ROS,  ?  ?Pulmonary exam normal ?breath sounds clear to auscultation ? ? ? ? ? ? Cardiovascular ?hypertension, Pt. on home beta blockers ?Normal cardiovascular exam ?Rhythm:Regular Rate:Normal ? ? ?  ?Neuro/Psych ? Headaches, negative psych ROS  ? GI/Hepatic ?negative GI ROS, Neg liver ROS,   ?Endo/Other  ?PCOS ? Renal/GU ?negative Renal ROS  ? ?  ?Musculoskeletal ? ?(+) Arthritis ,  ? Abdominal ?  ?Peds ? Hematology ?negative hematology ROS ?(+)   ?Anesthesia Other Findings ?Postmenopausal Bleeding ? Reproductive/Obstetrics ? ?  ? ? ? ? ? ? ? ? ? ? ? ? ? ?  ?  ? ? ? ? ? ? ? ?Anesthesia Physical ?Anesthesia Plan ? ?ASA: 2 ? ?Anesthesia Plan: General  ? ?Post-op Pain Management:   ? ?Induction: Intravenous ? ?PONV Risk Score and Plan: 3 and Ondansetron, Dexamethasone, Midazolam and Treatment may vary due to age or medical condition ? ?Airway Management Planned: LMA ? ?Additional Equipment:  ? ?Intra-op Plan:  ? ?Post-operative Plan: Extubation in OR ? ?Informed Consent: I have reviewed the patients History and Physical, chart, labs and discussed the procedure including the risks, benefits and alternatives for the proposed anesthesia with the patient or authorized representative who has indicated his/her understanding and acceptance.  ? ? ? ?Dental advisory given ? ?Plan Discussed with: CRNA ? ?Anesthesia Plan Comments:   ? ? ? ? ? ? ?Anesthesia Quick Evaluation ? ?

## 2021-08-13 NOTE — Interval H&P Note (Signed)
History and Physical Interval Note: ? ?08/13/2021 ?11:57 AM ? ?Shelly Myers  has presented today for surgery, with the diagnosis of Postmenopausal Bleeding.  The various methods of treatment have been discussed with the patient and family. After consideration of risks, benefits and other options for treatment, the patient has consented to  Procedure(s) with comments: ?DILATATION & CURETTAGE/HYSTEROSCOPY WITH MYOSURE (N/A) - rep will be here confirmed on 03/6 CS as a surgical intervention.  The patient's history has been reviewed, patient examined, no change in status, stable for surgery.  I have reviewed the patient's chart and labs.  Questions were answered to the patient's satisfaction.   ? ? ?Steva Ready ? ? ?

## 2021-08-13 NOTE — Op Note (Addendum)
Pre Op Dx:   ?1. Recurrent postmenopausal bleeding ?2. Endometrial thickening on ultrasound ? ?Post Op Dx:   ?1. Recurrent postmenopausal bleeding ?2. Endometrial thickening on ultrasound ?3. Endometrial polyps ? ?Procedure:   ?Hysteroscopy with Dilation and Curettage with Myosure Polypectomy ?  ?Surgeon:  Dr. Drema Dallas ?Assistants:  None ?Anesthesia:  General ?  ?EBL:  5cc  ?IVF:  See anesthesia documentation ?UOP:  Voided prior to arrival to OR ?Fluid Deficit: 130cc ?  ?Drains:  None ?Specimen removed:  Endometrial curettings and endometrial polyps - sent to pathology ?Device(s) implanted: None ?Case Type:  Clean-contaminated ?Findings:  Normal-appearing cervix. Uterus sounded to 6.5cm. Endometrium appears normal. Large broad-based polyp attaching at the fundus and smaller anterior wall polyp. Bilateral tubal ostia visualized. ?Complications: None ? ?Indications:  56 y.o. postmenopausal G1P1 on HRT with recurrent postmenopausal bleeding and endometrial thickness of 0.70cm on ultrasound. ? ?Description of each procedure:  After informed consent was obtained the patient was taken to the operating room in the dorsal supine position.  After administration of general anesthesia, the patient was placed in the dorsal lithotomy position and prepped and draped in the usual sterile fashion. A pre-operative time-out was completed.  The anterior lip of the cervix was grasped with a single-tooth tenaculum and the cervix was serially dilated to accommodate the hysteroscope.  The hysteroscope was advanced and the findings as above was noted. The Myosure Reach was used to resect the polyps and sample the endometrium.  A sharp banjo curette was used to curettage the endometrium. The single-tooth tenaculum was removed and its sites were made hemostatic with silver nitrate.  Adequate hemostasis was noted.  The patient was awakened and extubated and appeared to have tolerated the procedure well.  All counts were  correct. ?Disposition:  PACU ? ?Drema Dallas, DO ?

## 2021-08-13 NOTE — Discharge Instructions (Signed)
?  Post Anesthesia Home Care Instructions ?Next tylenol dose 5pm ?Activity: ?Get plenty of rest for the remainder of the day. A responsible individual must stay with you for 24 hours following the procedure.  ?For the next 24 hours, DO NOT: ?-Drive a car ?-Advertising copywriter ?-Drink alcoholic beverages ?-Take any medication unless instructed by your physician ?-Make any legal decisions or sign important papers. ? ?Meals: ?Start with liquid foods such as gelatin or soup. Progress to regular foods as tolerated. Avoid greasy, spicy, heavy foods. If nausea and/or vomiting occur, drink only clear liquids until the nausea and/or vomiting subsides. Call your physician if vomiting continues. ? ?Special Instructions/Symptoms: ?Your throat may feel dry or sore from the anesthesia or the breathing tube placed in your throat during surgery. If this causes discomfort, gargle with warm salt water. The discomfort should disappear within 24 hours. ? ?If you had a scopolamine patch placed behind your ear for the management of post- operative nausea and/or vomiting: ? ?1. The medication in the patch is effective for 72 hours, after which it should be removed.  Wrap patch in a tissue and discard in the trash. Wash hands thoroughly with soap and water. ?2. You may remove the patch earlier than 72 hours if you experience unpleasant side effects which may include dry mouth, dizziness or visual disturbances. ?3. Avoid touching the patch. Wash your hands with soap and water after contact with the patch. ?    ?

## 2021-08-14 ENCOUNTER — Encounter (HOSPITAL_BASED_OUTPATIENT_CLINIC_OR_DEPARTMENT_OTHER): Payer: Self-pay | Admitting: Obstetrics and Gynecology

## 2021-08-14 ENCOUNTER — Other Ambulatory Visit: Payer: BC Managed Care – PPO

## 2021-08-14 LAB — SURGICAL PATHOLOGY

## 2021-08-19 ENCOUNTER — Other Ambulatory Visit: Payer: Self-pay

## 2021-08-19 ENCOUNTER — Ambulatory Visit (INDEPENDENT_AMBULATORY_CARE_PROVIDER_SITE_OTHER)
Admission: RE | Admit: 2021-08-19 | Discharge: 2021-08-19 | Disposition: A | Discharge status: Home / Self Care | Payer: BC Managed Care – PPO | Source: Ambulatory Visit | Attending: Internal Medicine | Admitting: Internal Medicine

## 2021-08-19 DIAGNOSIS — M859 Disorder of bone density and structure, unspecified: Secondary | ICD-10-CM

## 2021-08-20 DIAGNOSIS — M859 Disorder of bone density and structure, unspecified: Secondary | ICD-10-CM | POA: Diagnosis not present

## 2021-08-21 ENCOUNTER — Encounter: Payer: Self-pay | Admitting: Internal Medicine

## 2021-08-21 DIAGNOSIS — F3342 Major depressive disorder, recurrent, in full remission: Secondary | ICD-10-CM | POA: Diagnosis not present

## 2021-08-29 DIAGNOSIS — Z4889 Encounter for other specified surgical aftercare: Secondary | ICD-10-CM | POA: Diagnosis not present

## 2021-09-11 DIAGNOSIS — G43009 Migraine without aura, not intractable, without status migrainosus: Secondary | ICD-10-CM | POA: Diagnosis not present

## 2021-09-11 DIAGNOSIS — M25562 Pain in left knee: Secondary | ICD-10-CM | POA: Diagnosis not present

## 2021-09-11 DIAGNOSIS — M47812 Spondylosis without myelopathy or radiculopathy, cervical region: Secondary | ICD-10-CM | POA: Diagnosis not present

## 2021-09-11 DIAGNOSIS — Z79891 Long term (current) use of opiate analgesic: Secondary | ICD-10-CM | POA: Diagnosis not present

## 2021-09-11 DIAGNOSIS — G894 Chronic pain syndrome: Secondary | ICD-10-CM | POA: Diagnosis not present

## 2021-09-15 ENCOUNTER — Other Ambulatory Visit: Payer: Self-pay | Admitting: Internal Medicine

## 2021-09-15 DIAGNOSIS — E282 Polycystic ovarian syndrome: Secondary | ICD-10-CM

## 2021-10-14 DIAGNOSIS — M25562 Pain in left knee: Secondary | ICD-10-CM | POA: Diagnosis not present

## 2021-10-14 DIAGNOSIS — M47812 Spondylosis without myelopathy or radiculopathy, cervical region: Secondary | ICD-10-CM | POA: Diagnosis not present

## 2021-10-14 DIAGNOSIS — G43009 Migraine without aura, not intractable, without status migrainosus: Secondary | ICD-10-CM | POA: Diagnosis not present

## 2021-10-14 DIAGNOSIS — G894 Chronic pain syndrome: Secondary | ICD-10-CM | POA: Diagnosis not present

## 2021-11-12 DIAGNOSIS — G43009 Migraine without aura, not intractable, without status migrainosus: Secondary | ICD-10-CM | POA: Diagnosis not present

## 2021-11-12 DIAGNOSIS — M25562 Pain in left knee: Secondary | ICD-10-CM | POA: Diagnosis not present

## 2021-11-12 DIAGNOSIS — M47812 Spondylosis without myelopathy or radiculopathy, cervical region: Secondary | ICD-10-CM | POA: Diagnosis not present

## 2021-11-12 DIAGNOSIS — G894 Chronic pain syndrome: Secondary | ICD-10-CM | POA: Diagnosis not present

## 2021-12-10 DIAGNOSIS — M25562 Pain in left knee: Secondary | ICD-10-CM | POA: Diagnosis not present

## 2021-12-10 DIAGNOSIS — G43009 Migraine without aura, not intractable, without status migrainosus: Secondary | ICD-10-CM | POA: Diagnosis not present

## 2021-12-10 DIAGNOSIS — G894 Chronic pain syndrome: Secondary | ICD-10-CM | POA: Diagnosis not present

## 2021-12-10 DIAGNOSIS — M47812 Spondylosis without myelopathy or radiculopathy, cervical region: Secondary | ICD-10-CM | POA: Diagnosis not present

## 2022-01-08 DIAGNOSIS — M47812 Spondylosis without myelopathy or radiculopathy, cervical region: Secondary | ICD-10-CM | POA: Diagnosis not present

## 2022-01-08 DIAGNOSIS — G43009 Migraine without aura, not intractable, without status migrainosus: Secondary | ICD-10-CM | POA: Diagnosis not present

## 2022-01-08 DIAGNOSIS — G894 Chronic pain syndrome: Secondary | ICD-10-CM | POA: Diagnosis not present

## 2022-01-08 DIAGNOSIS — M62830 Muscle spasm of back: Secondary | ICD-10-CM | POA: Diagnosis not present

## 2022-02-05 DIAGNOSIS — M47812 Spondylosis without myelopathy or radiculopathy, cervical region: Secondary | ICD-10-CM | POA: Diagnosis not present

## 2022-02-05 DIAGNOSIS — G894 Chronic pain syndrome: Secondary | ICD-10-CM | POA: Diagnosis not present

## 2022-02-05 DIAGNOSIS — M62830 Muscle spasm of back: Secondary | ICD-10-CM | POA: Diagnosis not present

## 2022-02-05 DIAGNOSIS — G43009 Migraine without aura, not intractable, without status migrainosus: Secondary | ICD-10-CM | POA: Diagnosis not present

## 2022-03-09 DIAGNOSIS — M47812 Spondylosis without myelopathy or radiculopathy, cervical region: Secondary | ICD-10-CM | POA: Diagnosis not present

## 2022-03-09 DIAGNOSIS — M62830 Muscle spasm of back: Secondary | ICD-10-CM | POA: Diagnosis not present

## 2022-03-09 DIAGNOSIS — G43009 Migraine without aura, not intractable, without status migrainosus: Secondary | ICD-10-CM | POA: Diagnosis not present

## 2022-03-09 DIAGNOSIS — G894 Chronic pain syndrome: Secondary | ICD-10-CM | POA: Diagnosis not present

## 2022-03-16 DIAGNOSIS — F3342 Major depressive disorder, recurrent, in full remission: Secondary | ICD-10-CM | POA: Diagnosis not present

## 2022-04-07 DIAGNOSIS — M62830 Muscle spasm of back: Secondary | ICD-10-CM | POA: Diagnosis not present

## 2022-04-07 DIAGNOSIS — G894 Chronic pain syndrome: Secondary | ICD-10-CM | POA: Diagnosis not present

## 2022-04-07 DIAGNOSIS — G43009 Migraine without aura, not intractable, without status migrainosus: Secondary | ICD-10-CM | POA: Diagnosis not present

## 2022-04-07 DIAGNOSIS — M47812 Spondylosis without myelopathy or radiculopathy, cervical region: Secondary | ICD-10-CM | POA: Diagnosis not present

## 2022-04-13 DIAGNOSIS — Z124 Encounter for screening for malignant neoplasm of cervix: Secondary | ICD-10-CM | POA: Diagnosis not present

## 2022-04-13 DIAGNOSIS — Z1151 Encounter for screening for human papillomavirus (HPV): Secondary | ICD-10-CM | POA: Diagnosis not present

## 2022-04-13 DIAGNOSIS — Z01419 Encounter for gynecological examination (general) (routine) without abnormal findings: Secondary | ICD-10-CM | POA: Diagnosis not present

## 2022-05-06 DIAGNOSIS — M47812 Spondylosis without myelopathy or radiculopathy, cervical region: Secondary | ICD-10-CM | POA: Diagnosis not present

## 2022-05-06 DIAGNOSIS — G43009 Migraine without aura, not intractable, without status migrainosus: Secondary | ICD-10-CM | POA: Diagnosis not present

## 2022-05-06 DIAGNOSIS — G894 Chronic pain syndrome: Secondary | ICD-10-CM | POA: Diagnosis not present

## 2022-05-06 DIAGNOSIS — M62830 Muscle spasm of back: Secondary | ICD-10-CM | POA: Diagnosis not present

## 2022-06-04 DIAGNOSIS — G43009 Migraine without aura, not intractable, without status migrainosus: Secondary | ICD-10-CM | POA: Diagnosis not present

## 2022-06-04 DIAGNOSIS — M62830 Muscle spasm of back: Secondary | ICD-10-CM | POA: Diagnosis not present

## 2022-06-04 DIAGNOSIS — M47812 Spondylosis without myelopathy or radiculopathy, cervical region: Secondary | ICD-10-CM | POA: Diagnosis not present

## 2022-06-04 DIAGNOSIS — G894 Chronic pain syndrome: Secondary | ICD-10-CM | POA: Diagnosis not present

## 2022-07-01 DIAGNOSIS — G894 Chronic pain syndrome: Secondary | ICD-10-CM | POA: Diagnosis not present

## 2022-07-01 DIAGNOSIS — M62830 Muscle spasm of back: Secondary | ICD-10-CM | POA: Diagnosis not present

## 2022-07-01 DIAGNOSIS — G43009 Migraine without aura, not intractable, without status migrainosus: Secondary | ICD-10-CM | POA: Diagnosis not present

## 2022-07-01 DIAGNOSIS — M47812 Spondylosis without myelopathy or radiculopathy, cervical region: Secondary | ICD-10-CM | POA: Diagnosis not present

## 2022-07-29 DIAGNOSIS — M47812 Spondylosis without myelopathy or radiculopathy, cervical region: Secondary | ICD-10-CM | POA: Diagnosis not present

## 2022-07-29 DIAGNOSIS — M62830 Muscle spasm of back: Secondary | ICD-10-CM | POA: Diagnosis not present

## 2022-07-29 DIAGNOSIS — G894 Chronic pain syndrome: Secondary | ICD-10-CM | POA: Diagnosis not present

## 2022-07-29 DIAGNOSIS — G43009 Migraine without aura, not intractable, without status migrainosus: Secondary | ICD-10-CM | POA: Diagnosis not present

## 2022-07-31 ENCOUNTER — Encounter: Payer: Self-pay | Admitting: Internal Medicine

## 2022-07-31 ENCOUNTER — Telehealth: Payer: Self-pay

## 2022-07-31 ENCOUNTER — Ambulatory Visit: Payer: BC Managed Care – PPO | Admitting: Internal Medicine

## 2022-07-31 VITALS — BP 110/72 | HR 96 | Ht 61.0 in | Wt 107.0 lb

## 2022-07-31 DIAGNOSIS — E282 Polycystic ovarian syndrome: Secondary | ICD-10-CM | POA: Diagnosis not present

## 2022-07-31 DIAGNOSIS — E673 Hypervitaminosis D: Secondary | ICD-10-CM

## 2022-07-31 DIAGNOSIS — M859 Disorder of bone density and structure, unspecified: Secondary | ICD-10-CM

## 2022-07-31 LAB — COMPREHENSIVE METABOLIC PANEL
ALT: 15 U/L (ref 0–35)
AST: 16 U/L (ref 0–37)
Albumin: 4 g/dL (ref 3.5–5.2)
Alkaline Phosphatase: 42 U/L (ref 39–117)
BUN: 19 mg/dL (ref 6–23)
CO2: 26 mEq/L (ref 19–32)
Calcium: 10.1 mg/dL (ref 8.4–10.5)
Chloride: 104 mEq/L (ref 96–112)
Creatinine, Ser: 1.23 mg/dL — ABNORMAL HIGH (ref 0.40–1.20)
GFR: 48.57 mL/min — ABNORMAL LOW (ref >60.00)
Glucose, Bld: 69 mg/dL — ABNORMAL LOW (ref 70–99)
Potassium: 4.6 mEq/L (ref 3.5–5.1)
Sodium: 139 mEq/L (ref 135–145)
Total Bilirubin: 0.2 mg/dL (ref 0.2–1.2)
Total Protein: 6.8 g/dL (ref 6.0–8.3)

## 2022-07-31 LAB — LIPID PANEL
Cholesterol: 228 mg/dL — ABNORMAL HIGH (ref 0–200)
HDL: 79.3 mg/dL (ref >39.00)
NonHDL: 148.95
Total CHOL/HDL Ratio: 3
Triglycerides: 210 mg/dL — ABNORMAL HIGH (ref 0.0–149.0)
VLDL: 42 mg/dL — ABNORMAL HIGH (ref 0.0–40.0)

## 2022-07-31 LAB — HEMOGLOBIN A1C: Hgb A1c MFr Bld: 5.2 % (ref 4.6–6.5)

## 2022-07-31 LAB — LDL CHOLESTEROL, DIRECT: Direct LDL: 141 mg/dL

## 2022-07-31 LAB — VITAMIN D 25 HYDROXY (VIT D DEFICIENCY, FRACTURES): VITD: 105.17 ng/mL (ref 30.00–100.00)

## 2022-07-31 MED ORDER — METFORMIN HCL ER 500 MG PO TB24: 1500.0000 mg | ORAL_TABLET | Freq: Every day | ORAL | 3 refills | Status: DC

## 2022-07-31 NOTE — Telephone Encounter (Signed)
Critical lab value received:  Vitamin D is 105

## 2022-07-31 NOTE — Telephone Encounter (Signed)
It is lower than before.  Reviewing Dr. Quin Hoop note, she just had a discussion with the patient about stopping tanning.  She is not on vitamin D supplements.  Not much else that she can do other than staying well-hydrated.

## 2022-07-31 NOTE — Progress Notes (Unsigned)
Name: Shelly Myers  MRN/ DOB: [id number], 1966/05/16    Age/ Sex: 57 y.o., female     PCP: Lois Huxley, PA   Reason for Endocrinology Evaluation: Hypervitaminosis D     Initial Endocrinology Clinic Visit: 04/17/2019    PATIENT IDENTIFIER: Shelly Myers is a 57 y.o., female with a past medical history of Hx of PCOS, migraine headaches and insomnia. She has followed with [facility] since 04/17/2019 for consultative assistance with management of her hypervitaminosis.   HISTORICAL SUMMARY:  Pt has been referred by Dr. Simona Huh for elevated Vitamin D levels > 120 ng/dL . This was attributed to excessive tanning.    Pt has been diagnosed with PCOS in [place] , was following with Endo there. Has been on Metformin for years.  Has 1 child without difficulty conceiving   DXA 08/14/2019 low bone density - T score -1.1 at the right femoral  neck .Mother with osteopenia / osteoporosis but no hip fracture  Menarche at age 89 . Was on OCP's until the age 54 yrs.     SUBJECTIVE:    Today (07/31/2022):  Shelly Myers is here for a follow up on hypervitaminosis D, PCOS    Weight stable  Denies nausea or vomiting  Has chronic constipation due to opioid use Denies polyuria and polydipsia  Denies renal stones.  She is back to tanning  She is not on fish oil   She is on MVI with vitamin D  Metformin 500 mg, TID    HISTORY:  Past Medical History:  Past Medical History:  Diagnosis Date   Allergy    Arthritis    DDD (degenerative disc disease), cervical    Hypertension    Osteopenia    PCOS (polycystic ovarian syndrome)    Past Surgical History:  Social History:  reports that she has never smoked. She has never used smokeless tobacco. She reports that she does not currently use alcohol. She reports that she does not use drugs. Family History:  Family History  Problem Relation Age of Onset   Hypertension Mother    Osteopenia Mother    Diabetes Father     Skin cancer Father    Prostate cancer Father    Esophageal cancer Father    Hypertension Father    Hyperlipidemia Father    Headache Neg Hx    Migraines Neg Hx      HOME MEDICATIONS: Allergies as of 07/31/2022   No Known Allergies      Medication List        Accurate as of July 31, 2022  1:53 PM. If you have any questions, ask your nurse or doctor.          STOP taking these medications    buprenorphine 10 MCG/HR Ptwk Commonly known as: BUTRANS Stopped by: Dorita Sciara, MD       TAKE these medications    Aimovig 140 MG/ML Soaj Generic drug: Erenumab-aooe Inject into the skin every 30 (thirty) days.   ALPRAZolam 1 MG tablet Commonly known as: XANAX Take 2 mg by mouth at bedtime.   clindamycin 1 % external solution Commonly known as: CLEOCIN T 3 times/day as needed-between meals & bedtime.   cyclobenzaprine 10 MG tablet Commonly known as: FLEXERIL TAKE 1/2 TABLET BY MOUTH EVERY MORNING AND AT NOON, AND TAKE 1 TABLET EVERY DAY AT BEDTIME   EPINEPHrine 0.3 mg/0.3 mL Soaj injection Commonly known as: EPI-PEN INJECT AS DIRECTED FOR LIFE THREATENING REACTION  fluticasone 50 MCG/ACT nasal spray Commonly known as: FLONASE Place 2 sprays into both nostrils as needed.   ibuprofen 800 MG tablet Commonly known as: ADVIL Take 1 tablet (800 mg total) by mouth every 8 (eight) hours as needed for moderate pain, cramping or mild pain.   metFORMIN 500 MG 24 hr tablet Commonly known as: GLUCOPHAGE-XR TAKE 3 TABLETS (1,500 MG TOTAL) BY MOUTH DAILY.   metoprolol tartrate 25 MG tablet Commonly known as: LOPRESSOR Take 25 mg by mouth 2 (two) times daily.   nystatin-triamcinolone ointment Commonly known as: MYCOLOG APPLY TOPICALLY TO THE AFFECTED AREA 2 TIMES A DAY.   oxyCODONE-acetaminophen 10-325 MG tablet Commonly known as: PERCOCET Take 1 tablet by mouth 2 (two) times daily as needed.   Prempro 0.45-1.5 MG tablet Generic drug: estrogen  (conjugated)-medroxyprogesterone Take 1 tablet by mouth daily.   rizatriptan 10 MG disintegrating tablet Commonly known as: MAXALT-MLT Take 10 mg by mouth daily as needed.   traZODone 150 MG tablet Commonly known as: DESYREL Take 300 mg by mouth at bedtime.   triamcinolone cream 0.5 % Commonly known as: KENALOG APPLY TO AFFECTED AREA TWICE DAILY AS NEEDED   Trintellix 20 MG Tabs tablet Generic drug: vortioxetine HBr Take 20 mg by mouth daily.          OBJECTIVE:   PHYSICAL EXAM: VS: BP 110/72 (BP Location: Left Arm, Patient Position: Sitting, Cuff Size: Small)   Pulse 96   Ht '5\' 1"'$  (1.549 m)   Wt 107 lb (48.5 kg)   SpO2 99%   BMI 20.22 kg/m    EXAM: General: Pt appears well and is in NAD  Neck: General: Supple without adenopathy. Thyroid: Thyroid size normal.  No goiter or nodules appreciated.  Lungs: Clear with good BS bilat with no rales, rhonchi, or wheezes  Heart: Auscultation: RRR.  Abdomen: Normoactive bowel sounds, soft, nontender, without masses or organomegaly palpable  Extremities:  BL LE: No pretibial edema normal ROM and strength.  Skin: Evidence of tanning again noted   Mental Status: Judgment, insight: Intact Orientation: Oriented to time, place, and person Mood and affect: No depression, anxiety, or agitation     DATA REVIEWED: *** DXA 08/19/2021 Results:   Lumbar spine L1-L4 (L2, L3) Femoral neck (FN) 33% distal radius  T-score -0.6 RFN: -1.2 LFN: -1.0 n/a  Change in BMD from previous DXA test (%)  -2.7%  -2.2% n/a  (*) statistically significant   Assessment: the BMD is low according to the [employer] classification for osteoporosis (see below).  ASSESSMENT / PLAN / RECOMMENDATIONS:   1. PCOS :    - A1c normal 5.1 %  - Tolerating Metformin  will continue   Medication  Metformin 500 mg TID   2.  Low bone density:   -DXA in 07/2021 showed  low bone density  - Emphasized importance of calcium intake  -I have asked her to stop the  multivitamin as it contains vitamin D and to switch to plain calcium -Encourage weightbearing exercises  Medication Start calcium 500 mg twice daily   3. Dyslipidemia :  -She has not started the fish oil -   4. Hypervitaminsosis D:    -  Unfortunately this is high again, she is back to tanning.  - The pt has been warned about the effects of hypervitaminosis in increasing risk of dehydration, CKD, nephrolithiasis etc  - She is not on any OTC vitamin D supplements - Other than advising her to stop tanning and complications of hypervitaminosis ,  I am not sure what else I can do.    Follow-up in 1 yr    Signed electronically by: Mack Guise, MD  [facility] Endocrinology  [facility] [address] [place], [place] [address] Phone: [phone] FAX: [phone]      CC: Lois Huxley, [place] [address] Phone: [phone]  Fax: [phone]   Return to Endocrinology clinic as below: Future Appointments  Date Time Provider [place]  07/31/2022  2:00 PM Shelly Myers, Melanie Crazier, MD [facility] None

## 2022-07-31 NOTE — Telephone Encounter (Signed)
Pt informed and expressed understanding. 

## 2022-07-31 NOTE — Patient Instructions (Signed)
Stop Multivitamin  Start Plain Calcium ( with NO vitamin D ) 500 mg twice daily  Start weight bearing exercise

## 2022-07-31 NOTE — Telephone Encounter (Signed)
Lab called with critical lab at 4:18 PM.  Call was transferred to clinical staff at 4:20 PM.

## 2022-08-03 ENCOUNTER — Telehealth: Payer: Self-pay | Admitting: Internal Medicine

## 2022-08-03 MED ORDER — ATORVASTATIN CALCIUM 10 MG PO TABS: 10.0000 mg | ORAL_TABLET | Freq: Every day | ORAL | 3 refills | Status: DC

## 2022-08-03 NOTE — Telephone Encounter (Signed)
Please let the patient know that her kidney function has decreased from 53 to 48 over the past year, normal kidney function needs to be above 60.   The patient needs to stop tanning because her vitamin D remains elevated at over 105 .  Every time her vitamin D is high, her kidney function is low   Please encourage hydration   Also, let the patient know that her bad cholesterol and triglycerides are high and I would recommend that she start atorvastatin 10 mg daily to reduce those numbers, in the meantime she will need to work on low-fat diet by avoiding fried food and reducing the amount of red meat but eating more white meat such as chicken breast/turkey breast and fresh fruits and vegetables   A1c stable, no change to metformin dose

## 2022-08-03 NOTE — Telephone Encounter (Signed)
Left vm for patient to call back.

## 2022-08-04 NOTE — Telephone Encounter (Signed)
LMTCB

## 2022-08-05 NOTE — Telephone Encounter (Signed)
LMTCB

## 2022-08-06 NOTE — Telephone Encounter (Signed)
Left detailed vm for patient regarding message below.

## 2022-08-17 DIAGNOSIS — F3342 Major depressive disorder, recurrent, in full remission: Secondary | ICD-10-CM | POA: Diagnosis not present

## 2022-08-20 DIAGNOSIS — C44519 Basal cell carcinoma of skin of other part of trunk: Secondary | ICD-10-CM | POA: Diagnosis not present

## 2022-08-27 DIAGNOSIS — G43009 Migraine without aura, not intractable, without status migrainosus: Secondary | ICD-10-CM | POA: Diagnosis not present

## 2022-08-27 DIAGNOSIS — G894 Chronic pain syndrome: Secondary | ICD-10-CM | POA: Diagnosis not present

## 2022-08-27 DIAGNOSIS — M62830 Muscle spasm of back: Secondary | ICD-10-CM | POA: Diagnosis not present

## 2022-08-27 DIAGNOSIS — M47812 Spondylosis without myelopathy or radiculopathy, cervical region: Secondary | ICD-10-CM | POA: Diagnosis not present

## 2022-09-04 DIAGNOSIS — M4310 Spondylolisthesis, site unspecified: Secondary | ICD-10-CM | POA: Diagnosis not present

## 2022-09-07 DIAGNOSIS — D485 Neoplasm of uncertain behavior of skin: Secondary | ICD-10-CM | POA: Diagnosis not present

## 2022-09-07 DIAGNOSIS — C44521 Squamous cell carcinoma of skin of breast: Secondary | ICD-10-CM | POA: Diagnosis not present

## 2022-09-07 DIAGNOSIS — C44529 Squamous cell carcinoma of skin of other part of trunk: Secondary | ICD-10-CM | POA: Diagnosis not present

## 2022-09-30 ENCOUNTER — Other Ambulatory Visit: Payer: Self-pay | Admitting: Neurological Surgery

## 2022-10-01 DIAGNOSIS — M47812 Spondylosis without myelopathy or radiculopathy, cervical region: Secondary | ICD-10-CM | POA: Diagnosis not present

## 2022-10-01 DIAGNOSIS — G43009 Migraine without aura, not intractable, without status migrainosus: Secondary | ICD-10-CM | POA: Diagnosis not present

## 2022-10-01 DIAGNOSIS — G894 Chronic pain syndrome: Secondary | ICD-10-CM | POA: Diagnosis not present

## 2022-10-01 DIAGNOSIS — M62830 Muscle spasm of back: Secondary | ICD-10-CM | POA: Diagnosis not present

## 2022-10-02 ENCOUNTER — Other Ambulatory Visit: Payer: Self-pay | Admitting: Neurological Surgery

## 2022-10-05 ENCOUNTER — Other Ambulatory Visit: Payer: Self-pay

## 2022-10-05 ENCOUNTER — Encounter (HOSPITAL_COMMUNITY): Payer: Self-pay | Admitting: Neurological Surgery

## 2022-10-05 NOTE — Pre-Procedure Instructions (Signed)
SDW CALL  Patient was given pre-op instructions over the phone. The opportunity was given for the patient to ask questions. No further questions asked. Patient verbalized understanding of instructions given.   PCP - Wilfrid Lund, PA  Cardiologist - denies  PPM/ICD - denies   Chest x-ray - denies EKG - DOS Stress Test -denies  ECHO - denies Cardiac Cath - denies  Sleep Study - denies   Fasting Blood Sugar - denies   Blood Thinner Instructions: N/A Aspirin Instructions:N/A  ERAS Protcol - ERAS per order   COVID TEST-  N/A   Anesthesia review: no  Patient states that she usually takes her medications around 12 or 1PM. Pt instructed that if she does not want to take her metoprolol prior to coming to hospital, then she can take her metoprolol a little later, but prior to going to surgery. Pt informed that if she brings any medications to the hospital, she will need to have her husband hold on to these for her.   Patient denies shortness of breath, fever, cough and chest pain over the phone call    Surgical Instructions    Your procedure is scheduled on 10/06/22  Report to [facility] Main Entrance "A" at 9:45 A.M., then check in with the Admitting office.  Call this number if you have problems the morning of surgery:  [phone]    Remember:  Do not eat after midnight the night before your surgery  You may drink clear liquids until 9:15AM the morning of your surgery.   Clear liquids allowed are: Water, Non-Citrus Juices (without pulp), Carbonated Beverages, Clear Tea, Black Coffee ONLY (NO MILK, CREAM OR POWDERED CREAMER of any kind), and Gatorade   Take these medicines the morning of surgery with A SIP OF WATER:  Flexeril, metoprolol, prempro, trintellix, percocet PRN, maxalt PRN   As of today, STOP taking any Aspirin (unless otherwise instructed by your surgeon) Aleve, Naproxen, Ibuprofen, Motrin, Advil, Goody's, BC's, all herbal medications, fish oil, and  all vitamins.  [facility] is not responsible for any belongings or valuables.  Contacts, glasses, hearing aids, dentures or partials may not be worn into surgery, please bring cases for these belongings   Patients discharged the day of surgery will not be allowed to drive home, and someone needs to stay with them for 24 hours.   SURGICAL WAITING ROOM VISITATION You may have 1 visitor in the pre-op area at a time determined by the pre-op nurse. (Visitor may not switch out) Patients having surgery or a procedure in a hospital may have two support people in the waiting room.  Please refer to the [facility] website for the visitor guidelines for Inpatients (after your surgery is over and you are in a regular room).     Day of Surgery:  Take a shower the day of or night before with antibacterial soap. Wear Clean/Comfortable clothing the morning of surgery Do not apply any deodorants/lotions.   Do not wear jewelry or makeup Do not wear lotions, powders, perfumes/colognes, or deodorant. Do not shave 48 hours prior to surgery. Do not bring valuables to the hospital. Do not wear nail polish, gel polish, artificial nails, or any other type of covering on natural nails (fingers and toes) Remember to brush your teeth WITH YOUR REGULAR TOOTHPASTE.

## 2022-10-06 ENCOUNTER — Inpatient Hospital Stay (HOSPITAL_COMMUNITY): Payer: BC Managed Care – PPO

## 2022-10-06 ENCOUNTER — Encounter (HOSPITAL_COMMUNITY): Payer: Self-pay | Admitting: Neurological Surgery

## 2022-10-06 ENCOUNTER — Inpatient Hospital Stay (HOSPITAL_COMMUNITY)
Admission: RE | Admit: 2022-10-06 | Discharge: 2022-10-07 | DRG: 460 | Disposition: A | Discharge status: Home / Self Care | Payer: BC Managed Care – PPO | Attending: Neurological Surgery | Admitting: Neurological Surgery

## 2022-10-06 ENCOUNTER — Encounter (HOSPITAL_COMMUNITY)
Admission: RE | Disposition: A | Discharge status: Home / Self Care | Payer: Self-pay | Source: Home / Self Care | Attending: Neurological Surgery

## 2022-10-06 DIAGNOSIS — I10 Essential (primary) hypertension: Secondary | ICD-10-CM | POA: Diagnosis not present

## 2022-10-06 DIAGNOSIS — Z83438 Family history of other disorder of lipoprotein metabolism and other lipidemia: Secondary | ICD-10-CM | POA: Diagnosis not present

## 2022-10-06 DIAGNOSIS — M4316 Spondylolisthesis, lumbar region: Principal | ICD-10-CM | POA: Diagnosis present

## 2022-10-06 DIAGNOSIS — M4317 Spondylolisthesis, lumbosacral region: Secondary | ICD-10-CM | POA: Diagnosis present

## 2022-10-06 DIAGNOSIS — M4310 Spondylolisthesis, site unspecified: Principal | ICD-10-CM

## 2022-10-06 DIAGNOSIS — Z833 Family history of diabetes mellitus: Secondary | ICD-10-CM

## 2022-10-06 DIAGNOSIS — Z8249 Family history of ischemic heart disease and other diseases of the circulatory system: Secondary | ICD-10-CM | POA: Diagnosis not present

## 2022-10-06 DIAGNOSIS — Z8042 Family history of malignant neoplasm of prostate: Secondary | ICD-10-CM | POA: Diagnosis not present

## 2022-10-06 DIAGNOSIS — K219 Gastro-esophageal reflux disease without esophagitis: Secondary | ICD-10-CM | POA: Diagnosis not present

## 2022-10-06 DIAGNOSIS — Z808 Family history of malignant neoplasm of other organs or systems: Secondary | ICD-10-CM | POA: Diagnosis not present

## 2022-10-06 DIAGNOSIS — Z85828 Personal history of other malignant neoplasm of skin: Secondary | ICD-10-CM | POA: Diagnosis not present

## 2022-10-06 DIAGNOSIS — Z800 Family history of malignant neoplasm of digestive organs: Secondary | ICD-10-CM | POA: Diagnosis not present

## 2022-10-06 DIAGNOSIS — Z981 Arthrodesis status: Secondary | ICD-10-CM | POA: Diagnosis not present

## 2022-10-06 DIAGNOSIS — Z4789 Encounter for other orthopedic aftercare: Secondary | ICD-10-CM | POA: Diagnosis not present

## 2022-10-06 HISTORY — DX: Gastro-esophageal reflux disease without esophagitis: K21.9

## 2022-10-06 HISTORY — PX: TRANSFORAMINAL LUMBAR INTERBODY FUSION W/ MIS 2 LEVEL: SHX6146

## 2022-10-06 HISTORY — DX: Family history of other specified conditions: Z84.89

## 2022-10-06 HISTORY — DX: Malignant (primary) neoplasm, unspecified: C80.1

## 2022-10-06 LAB — BASIC METABOLIC PANEL
Anion gap: 10 (ref 5–15)
BUN: 19 mg/dL (ref 6–20)
CO2: 23 mmol/L (ref 22–32)
Calcium: 8.5 mg/dL — ABNORMAL LOW (ref 8.9–10.3)
Chloride: 101 mmol/L (ref 98–111)
Creatinine, Ser: 1.2 mg/dL — ABNORMAL HIGH (ref 0.44–1.00)
GFR, Estimated: 52 mL/min — ABNORMAL LOW (ref >60)
Glucose, Bld: 102 mg/dL — ABNORMAL HIGH (ref 70–99)
Potassium: 3.5 mmol/L (ref 3.5–5.1)
Sodium: 134 mmol/L — ABNORMAL LOW (ref 135–145)

## 2022-10-06 LAB — CBC
HCT: 32.6 % — ABNORMAL LOW (ref 36.0–46.0)
Hemoglobin: 11.1 g/dL — ABNORMAL LOW (ref 12.0–15.0)
MCH: 33.6 pg (ref 26.0–34.0)
MCHC: 34 g/dL (ref 30.0–36.0)
MCV: 98.8 fL (ref 80.0–100.0)
Platelets: 348 10*3/uL (ref 150–400)
RBC: 3.3 MIL/uL — ABNORMAL LOW (ref 3.87–5.11)
RDW: 11.8 % (ref 11.5–15.5)
WBC: 6 10*3/uL (ref 4.0–10.5)
nRBC: 0 % (ref 0.0–0.2)

## 2022-10-06 LAB — SURGICAL PCR SCREEN
MRSA, PCR: NEGATIVE
Staphylococcus aureus: NEGATIVE

## 2022-10-06 LAB — TYPE AND SCREEN
ABO/RH(D): O POS
Antibody Screen: NEGATIVE

## 2022-10-06 SURGERY — MINIMALLY INVASIVE (MIS) TRANSFORAMINAL LUMBAR INTERBODY FUSION (TLIF) 2 LEVEL
Anesthesia: General | Site: Spine Lumbar

## 2022-10-06 MED ORDER — ONDANSETRON HCL 4 MG PO TABS: 4.0000 mg | ORAL_TABLET | Freq: Four times a day (QID) | ORAL | Status: DC | PRN

## 2022-10-06 MED ORDER — CEFAZOLIN SODIUM-DEXTROSE 2-4 GM/100ML-% IV SOLN
2.0000 g | Freq: Three times a day (TID) | INTRAVENOUS | Status: AC
  Administered 2022-10-06 – 2022-10-07 (×2): 2 g via INTRAVENOUS
  Filled 2022-10-06 (×2): qty 100

## 2022-10-06 MED ORDER — PHENOL 1.4 % MT LIQD: 1.0000 | OROMUCOSAL | Status: DC | PRN

## 2022-10-06 MED ORDER — MIDAZOLAM HCL 2 MG/2ML IJ SOLN: 0.5000 mg | Freq: Once | INTRAMUSCULAR | Status: DC | PRN

## 2022-10-06 MED ORDER — LIDOCAINE 2% (20 MG/ML) 5 ML SYRINGE
INTRAMUSCULAR | Status: AC
  Filled 2022-10-06: qty 5

## 2022-10-06 MED ORDER — RIZATRIPTAN BENZOATE 10 MG PO TBDP: 10.0000 mg | ORAL_TABLET | Freq: Every day | ORAL | Status: DC | PRN

## 2022-10-06 MED ORDER — VORTIOXETINE HBR 20 MG PO TABS
20.0000 mg | ORAL_TABLET | Freq: Every day | ORAL | Status: DC
  Administered 2022-10-07: 20 mg via ORAL
  Filled 2022-10-06: qty 1

## 2022-10-06 MED ORDER — MIDAZOLAM HCL 2 MG/2ML IJ SOLN
INTRAMUSCULAR | Status: DC | PRN
  Administered 2022-10-06 (×2): 1 mg via INTRAVENOUS

## 2022-10-06 MED ORDER — DEXAMETHASONE SODIUM PHOSPHATE 10 MG/ML IJ SOLN
INTRAMUSCULAR | Status: AC
  Filled 2022-10-06: qty 1

## 2022-10-06 MED ORDER — SUGAMMADEX SODIUM 200 MG/2ML IV SOLN
INTRAVENOUS | Status: DC | PRN
  Administered 2022-10-06: 200 mg via INTRAVENOUS

## 2022-10-06 MED ORDER — LIDOCAINE-EPINEPHRINE 1 %-1:100000 IJ SOLN
INTRAMUSCULAR | Status: AC
  Filled 2022-10-06: qty 1

## 2022-10-06 MED ORDER — EPHEDRINE 5 MG/ML INJ
INTRAVENOUS | Status: AC
  Filled 2022-10-06: qty 5

## 2022-10-06 MED ORDER — CEFAZOLIN SODIUM-DEXTROSE 2-4 GM/100ML-% IV SOLN
2.0000 g | INTRAVENOUS | Status: AC
  Administered 2022-10-06: 2 g via INTRAVENOUS
  Filled 2022-10-06: qty 100

## 2022-10-06 MED ORDER — CHLORHEXIDINE GLUCONATE CLOTH 2 % EX PADS: 6.0000 | MEDICATED_PAD | Freq: Once | CUTANEOUS | Status: DC

## 2022-10-06 MED ORDER — LACTATED RINGERS IV SOLN: INTRAVENOUS | Status: DC

## 2022-10-06 MED ORDER — HYDROMORPHONE HCL 1 MG/ML IJ SOLN
INTRAMUSCULAR | Status: AC
  Filled 2022-10-06: qty 1

## 2022-10-06 MED ORDER — TRAZODONE HCL 100 MG PO TABS
400.0000 mg | ORAL_TABLET | Freq: Every day | ORAL | Status: DC
  Administered 2022-10-06: 400 mg via ORAL
  Filled 2022-10-06 (×2): qty 4

## 2022-10-06 MED ORDER — THROMBIN 5000 UNITS EX SOLR: OROMUCOSAL | Status: DC | PRN

## 2022-10-06 MED ORDER — HYDROMORPHONE HCL 1 MG/ML IJ SOLN: 1.0000 mg | INTRAMUSCULAR | Status: DC | PRN

## 2022-10-06 MED ORDER — ROCURONIUM BROMIDE 10 MG/ML (PF) SYRINGE
PREFILLED_SYRINGE | INTRAVENOUS | Status: DC | PRN
  Administered 2022-10-06 (×2): 5 mg via INTRAVENOUS
  Administered 2022-10-06: 10 mg via INTRAVENOUS
  Administered 2022-10-06: 40 mg via INTRAVENOUS
  Administered 2022-10-06 (×2): 10 mg via INTRAVENOUS

## 2022-10-06 MED ORDER — CELECOXIB 200 MG PO CAPS
200.0000 mg | ORAL_CAPSULE | Freq: Every day | ORAL | Status: DC
  Administered 2022-10-06 – 2022-10-07 (×2): 200 mg via ORAL
  Filled 2022-10-06 (×2): qty 1

## 2022-10-06 MED ORDER — CLINDAMYCIN PHOSPHATE 1 % EX SOLN: Freq: Two times a day (BID) | CUTANEOUS | Status: DC

## 2022-10-06 MED ORDER — ONDANSETRON HCL 4 MG/2ML IJ SOLN
INTRAMUSCULAR | Status: DC | PRN
  Administered 2022-10-06: 4 mg via INTRAVENOUS

## 2022-10-06 MED ORDER — OXYCODONE HCL 5 MG PO TABS
ORAL_TABLET | ORAL | Status: AC
  Filled 2022-10-06: qty 1

## 2022-10-06 MED ORDER — METFORMIN HCL ER 500 MG PO TB24
1500.0000 mg | ORAL_TABLET | Freq: Every day | ORAL | Status: DC
  Administered 2022-10-07: 1500 mg via ORAL
  Filled 2022-10-06: qty 3

## 2022-10-06 MED ORDER — OXYCODONE HCL 5 MG PO TABS
5.0000 mg | ORAL_TABLET | Freq: Once | ORAL | Status: AC | PRN
  Administered 2022-10-06: 5 mg via ORAL

## 2022-10-06 MED ORDER — ALPRAZOLAM 0.5 MG PO TABS
3.0000 mg | ORAL_TABLET | Freq: Every day | ORAL | Status: DC
  Administered 2022-10-06: 3 mg via ORAL
  Filled 2022-10-06: qty 6

## 2022-10-06 MED ORDER — PHENYLEPHRINE 80 MCG/ML (10ML) SYRINGE FOR IV PUSH (FOR BLOOD PRESSURE SUPPORT)
PREFILLED_SYRINGE | INTRAVENOUS | Status: DC | PRN
  Administered 2022-10-06 (×2): 80 ug via INTRAVENOUS
  Administered 2022-10-06: 120 ug via INTRAVENOUS
  Administered 2022-10-06: 80 ug via INTRAVENOUS

## 2022-10-06 MED ORDER — THROMBIN 5000 UNITS EX SOLR
CUTANEOUS | Status: AC
  Filled 2022-10-06: qty 5000

## 2022-10-06 MED ORDER — OXYCODONE HCL 5 MG PO TABS: 5.0000 mg | ORAL_TABLET | ORAL | Status: DC | PRN

## 2022-10-06 MED ORDER — EPINEPHRINE 0.3 MG/0.3ML IJ SOAJ: 0.3000 mg | INTRAMUSCULAR | Status: DC | PRN

## 2022-10-06 MED ORDER — SODIUM CHLORIDE 0.9% FLUSH: 3.0000 mL | Freq: Two times a day (BID) | INTRAVENOUS | Status: DC

## 2022-10-06 MED ORDER — ACETAMINOPHEN 325 MG PO TABS
650.0000 mg | ORAL_TABLET | ORAL | Status: DC | PRN
  Administered 2022-10-06 – 2022-10-07 (×3): 650 mg via ORAL
  Filled 2022-10-06 (×3): qty 2

## 2022-10-06 MED ORDER — ACETAMINOPHEN 650 MG RE SUPP: 650.0000 mg | RECTAL | Status: DC | PRN

## 2022-10-06 MED ORDER — FENTANYL CITRATE (PF) 250 MCG/5ML IJ SOLN
INTRAMUSCULAR | Status: AC
  Filled 2022-10-06: qty 5

## 2022-10-06 MED ORDER — LORATADINE 10 MG PO TABS
10.0000 mg | ORAL_TABLET | Freq: Every day | ORAL | Status: DC
  Administered 2022-10-06 – 2022-10-07 (×2): 10 mg via ORAL
  Filled 2022-10-06 (×2): qty 1

## 2022-10-06 MED ORDER — SODIUM CHLORIDE 0.9 % IV SOLN: 250.0000 mL | INTRAVENOUS | Status: DC

## 2022-10-06 MED ORDER — LIDOCAINE 2% (20 MG/ML) 5 ML SYRINGE
INTRAMUSCULAR | Status: DC | PRN
  Administered 2022-10-06: 40 mg via INTRAVENOUS

## 2022-10-06 MED ORDER — POLYETHYLENE GLYCOL 3350 17 G PO PACK: 17.0000 g | PACK | Freq: Every day | ORAL | Status: DC | PRN

## 2022-10-06 MED ORDER — SCOPOLAMINE 1 MG/3DAYS TD PT72
1.0000 | MEDICATED_PATCH | TRANSDERMAL | Status: DC
  Administered 2022-10-06: 1.5 mg via TRANSDERMAL
  Filled 2022-10-06: qty 1

## 2022-10-06 MED ORDER — PROPOFOL 10 MG/ML IV BOLUS
INTRAVENOUS | Status: DC | PRN
  Administered 2022-10-06: 40 mg via INTRAVENOUS
  Administered 2022-10-06: 160 mg via INTRAVENOUS

## 2022-10-06 MED ORDER — ROCURONIUM BROMIDE 10 MG/ML (PF) SYRINGE
PREFILLED_SYRINGE | INTRAVENOUS | Status: AC
  Filled 2022-10-06: qty 10

## 2022-10-06 MED ORDER — SODIUM CHLORIDE 0.9% FLUSH: 3.0000 mL | INTRAVENOUS | Status: DC | PRN

## 2022-10-06 MED ORDER — CYCLOBENZAPRINE HCL 5 MG PO TABS
5.0000 mg | ORAL_TABLET | Freq: Three times a day (TID) | ORAL | Status: DC | PRN
  Administered 2022-10-07: 5 mg via ORAL
  Filled 2022-10-06: qty 1

## 2022-10-06 MED ORDER — PHENYLEPHRINE HCL-NACL 20-0.9 MG/250ML-% IV SOLN
INTRAVENOUS | Status: DC | PRN
  Administered 2022-10-06: 15 ug/min via INTRAVENOUS

## 2022-10-06 MED ORDER — PROMETHAZINE HCL 25 MG/ML IJ SOLN: 6.2500 mg | INTRAMUSCULAR | Status: DC | PRN

## 2022-10-06 MED ORDER — HYDROMORPHONE HCL 1 MG/ML IJ SOLN
INTRAMUSCULAR | Status: DC | PRN
  Administered 2022-10-06: .5 mg via INTRAVENOUS

## 2022-10-06 MED ORDER — HYDROMORPHONE HCL 1 MG/ML IJ SOLN
INTRAMUSCULAR | Status: AC
  Filled 2022-10-06: qty 0.5

## 2022-10-06 MED ORDER — OXYCODONE HCL 5 MG PO TABS
10.0000 mg | ORAL_TABLET | ORAL | Status: DC | PRN
  Administered 2022-10-06 – 2022-10-07 (×3): 10 mg via ORAL
  Filled 2022-10-06 (×3): qty 2

## 2022-10-06 MED ORDER — MIDAZOLAM HCL 2 MG/2ML IJ SOLN
INTRAMUSCULAR | Status: AC
  Filled 2022-10-06: qty 2

## 2022-10-06 MED ORDER — HYDROMORPHONE HCL 1 MG/ML IJ SOLN
0.2500 mg | INTRAMUSCULAR | Status: DC | PRN
  Administered 2022-10-06 (×4): 0.5 mg via INTRAVENOUS

## 2022-10-06 MED ORDER — DEXAMETHASONE SODIUM PHOSPHATE 10 MG/ML IJ SOLN
INTRAMUSCULAR | Status: DC | PRN
  Administered 2022-10-06: 10 mg via INTRAVENOUS

## 2022-10-06 MED ORDER — ACETAMINOPHEN 500 MG PO TABS
1000.0000 mg | ORAL_TABLET | ORAL | Status: AC
  Administered 2022-10-06: 500 mg via ORAL
  Filled 2022-10-06: qty 2

## 2022-10-06 MED ORDER — FENTANYL CITRATE (PF) 250 MCG/5ML IJ SOLN
INTRAMUSCULAR | Status: DC | PRN
  Administered 2022-10-06: 50 ug via INTRAVENOUS
  Administered 2022-10-06 (×2): 25 ug via INTRAVENOUS
  Administered 2022-10-06: 100 ug via INTRAVENOUS
  Administered 2022-10-06 (×2): 25 ug via INTRAVENOUS

## 2022-10-06 MED ORDER — PROPOFOL 10 MG/ML IV BOLUS
INTRAVENOUS | Status: AC
  Filled 2022-10-06: qty 20

## 2022-10-06 MED ORDER — DOCUSATE SODIUM 100 MG PO CAPS
100.0000 mg | ORAL_CAPSULE | Freq: Two times a day (BID) | ORAL | Status: DC
  Administered 2022-10-06 – 2022-10-07 (×2): 100 mg via ORAL
  Filled 2022-10-06 (×2): qty 1

## 2022-10-06 MED ORDER — MENTHOL 3 MG MT LOZG: 1.0000 | LOZENGE | OROMUCOSAL | Status: DC | PRN

## 2022-10-06 MED ORDER — TRIAMCINOLONE ACETONIDE 0.1 % EX CREA: 1.0000 | TOPICAL_CREAM | Freq: Two times a day (BID) | CUTANEOUS | Status: DC | PRN

## 2022-10-06 MED ORDER — METOPROLOL SUCCINATE ER 25 MG PO TB24
25.0000 mg | ORAL_TABLET | Freq: Every day | ORAL | Status: DC
  Filled 2022-10-06: qty 1

## 2022-10-06 MED ORDER — ONDANSETRON HCL 4 MG/2ML IJ SOLN: 4.0000 mg | Freq: Four times a day (QID) | INTRAMUSCULAR | Status: DC | PRN

## 2022-10-06 MED ORDER — 0.9 % SODIUM CHLORIDE (POUR BTL) OPTIME
TOPICAL | Status: DC | PRN
  Administered 2022-10-06: 1000 mL

## 2022-10-06 MED ORDER — LIDOCAINE-EPINEPHRINE 1 %-1:100000 IJ SOLN
INTRAMUSCULAR | Status: DC | PRN
  Administered 2022-10-06: 10 mL

## 2022-10-06 MED ORDER — MEPERIDINE HCL 25 MG/ML IJ SOLN: 6.2500 mg | INTRAMUSCULAR | Status: DC | PRN

## 2022-10-06 MED ORDER — CHLORHEXIDINE GLUCONATE 0.12 % MT SOLN
15.0000 mL | OROMUCOSAL | Status: AC
  Administered 2022-10-06: 15 mL via OROMUCOSAL
  Filled 2022-10-06: qty 15

## 2022-10-06 MED ORDER — ONDANSETRON HCL 4 MG/2ML IJ SOLN
INTRAMUSCULAR | Status: AC
  Filled 2022-10-06: qty 2

## 2022-10-06 MED ORDER — PHENYLEPHRINE 80 MCG/ML (10ML) SYRINGE FOR IV PUSH (FOR BLOOD PRESSURE SUPPORT)
PREFILLED_SYRINGE | INTRAVENOUS | Status: AC
  Filled 2022-10-06: qty 10

## 2022-10-06 MED ORDER — OXYCODONE HCL 5 MG/5ML PO SOLN: 5.0000 mg | Freq: Once | ORAL | Status: AC | PRN

## 2022-10-06 SURGICAL SUPPLY — 75 items
ADH SKN CLS APL DERMABOND .7 (GAUZE/BANDAGES/DRESSINGS) ×4
BAG COUNTER SPONGE SURGICOUNT (BAG) ×2 IMPLANT
BAG SPNG CNTER NS LX DISP (BAG) ×2
BAND INSRT 18 STRL LF DISP RB (MISCELLANEOUS) ×4
BAND RUBBER #18 3X1/16 STRL (MISCELLANEOUS) ×4 IMPLANT
BASKET BONE COLLECTION (BASKET) ×2 IMPLANT
BLADE CLIPPER SURG (BLADE) IMPLANT
BLADE SURG 11 STRL SS (BLADE) ×2 IMPLANT
BUR 14 MATCH 3 (BUR) IMPLANT
BUR MATCHSTICK NEURO 3.0 LAGG (BURR) IMPLANT
BUR MR8 14CM BALL SYMTRI 5 (BUR) IMPLANT
BUR SURG IBUR 4X12.5 (BURR) ×2 IMPLANT
BURR 14 MATCH 3 (BUR)
BURR MR8 14CM BALL SYMTRI 5 (BUR)
BURR SURG IBUR 4X12.5 (BURR) ×2
CAGE EXP CATALYFT SHORT 9X22.5 (Cage) IMPLANT
CAGE INTERBODY PL SHT 7X22.5 (Plate) IMPLANT
CNTNR URN SCR LID CUP LEK RST (MISCELLANEOUS) ×2 IMPLANT
CONT SPEC 4OZ STRL OR WHT (MISCELLANEOUS) ×2
COVER BACK TABLE 60X90IN (DRAPES) ×2 IMPLANT
COVERAGE SUPPORT O-ARM STEALTH (MISCELLANEOUS) ×2 IMPLANT
DERMABOND ADVANCED .7 DNX12 (GAUZE/BANDAGES/DRESSINGS) ×2 IMPLANT
DRAPE C-ARM 42X72 X-RAY (DRAPES) ×2 IMPLANT
DRAPE C-ARMOR (DRAPES) ×2 IMPLANT
DRAPE LAPAROTOMY 100X72X124 (DRAPES) ×2 IMPLANT
DRAPE MICROSCOPE SLANT 54X150 (MISCELLANEOUS) ×2 IMPLANT
DRAPE SHEET LG 3/4 BI-LAMINATE (DRAPES) ×8 IMPLANT
DRAPE SURG 17X23 STRL (DRAPES) ×4 IMPLANT
ELECT BLADE 6.5 EXT (BLADE) ×2 IMPLANT
ELECT REM PT RETURN 9FT ADLT (ELECTROSURGICAL) ×2
ELECTRODE REM PT RTRN 9FT ADLT (ELECTROSURGICAL) ×2 IMPLANT
EXTENDER TAB GUIDE SV 5.5/6.0 (INSTRUMENTS) IMPLANT
FEE COVERAGE SUPPORT O-ARM (MISCELLANEOUS) ×2 IMPLANT
GAUZE 4X4 16PLY ~~LOC~~+RFID DBL (SPONGE) IMPLANT
GAUZE SPONGE 4X4 12PLY STRL (GAUZE/BANDAGES/DRESSINGS) ×2 IMPLANT
GLOVE BIOGEL PI IND STRL 7.5 (GLOVE) ×2 IMPLANT
GLOVE ECLIPSE 7.5 STRL STRAW (GLOVE) ×2 IMPLANT
GOWN STRL REUS W/ TWL LRG LVL3 (GOWN DISPOSABLE) ×2 IMPLANT
GOWN STRL REUS W/ TWL XL LVL3 (GOWN DISPOSABLE) IMPLANT
GOWN STRL REUS W/TWL 2XL LVL3 (GOWN DISPOSABLE) IMPLANT
GOWN STRL REUS W/TWL LRG LVL3 (GOWN DISPOSABLE) ×2
GOWN STRL REUS W/TWL XL LVL3 (GOWN DISPOSABLE)
HEMOSTAT POWDER KIT SURGIFOAM (HEMOSTASIS) ×2 IMPLANT
KIT BASIN OR (CUSTOM PROCEDURE TRAY) ×2 IMPLANT
KIT INFUSE SMALL (Orthopedic Implant) IMPLANT
KIT POSITION SURG JACKSON T1 (MISCELLANEOUS) ×2 IMPLANT
KIT TURNOVER KIT B (KITS) IMPLANT
MARKER SPHERE PSV REFLC NDI (MISCELLANEOUS) ×10 IMPLANT
NDL HYPO 18GX1.5 BLUNT FILL (NEEDLE) IMPLANT
NDL SPNL 18GX3.5 QUINCKE PK (NEEDLE) IMPLANT
NEEDLE HYPO 18GX1.5 BLUNT FILL (NEEDLE) IMPLANT
NEEDLE HYPO 22GX1.5 SAFETY (NEEDLE) ×2 IMPLANT
NEEDLE SPNL 18GX3.5 QUINCKE PK (NEEDLE) IMPLANT
NS IRRIG 1000ML POUR BTL (IV SOLUTION) ×2 IMPLANT
PACK LAMINECTOMY NEURO (CUSTOM PROCEDURE TRAY) ×2 IMPLANT
PAD ARMBOARD 7.5X6 YLW CONV (MISCELLANEOUS) ×4 IMPLANT
PIN BONE FIX 100 (PIN) IMPLANT
ROD PERC CCM 5.5X50 (Rod) IMPLANT
SCREW FENS MAS CCM 6.5X30 (Screw) IMPLANT
SCREW MAS VOYAGER 6.5X35 (Screw) IMPLANT
SCREW MAS VOYAGER 6.5X40 (Screw) IMPLANT
SCREW SET 5.5/6.0MM SOLERA (Screw) IMPLANT
SOL ELECTROSURG ANTI STICK (MISCELLANEOUS) ×2
SOLUTION ELECTROSURG ANTI STCK (MISCELLANEOUS) ×4 IMPLANT
SPIKE FLUID TRANSFER (MISCELLANEOUS) ×2 IMPLANT
SPONGE T-LAP 4X18 ~~LOC~~+RFID (SPONGE) IMPLANT
SUT MNCRL AB 3-0 PS2 18 (SUTURE) ×2 IMPLANT
SUT VIC AB 0 CT1 18XCR BRD8 (SUTURE) IMPLANT
SUT VIC AB 0 CT1 8-18 (SUTURE)
SUT VIC AB 2-0 CP2 18 (SUTURE) ×2 IMPLANT
SYR 3ML LL SCALE MARK (SYRINGE) IMPLANT
TOWEL GREEN STERILE (TOWEL DISPOSABLE) ×2 IMPLANT
TOWEL GREEN STERILE FF (TOWEL DISPOSABLE) ×2 IMPLANT
TRAY FOLEY MTR SLVR 16FR STAT (SET/KITS/TRAYS/PACK) IMPLANT
WATER STERILE IRR 1000ML POUR (IV SOLUTION) ×2 IMPLANT

## 2022-10-06 NOTE — Anesthesia Preprocedure Evaluation (Addendum)
Anesthesia Evaluation  Patient identified by MRN, date of birth, ID band Patient awake    Reviewed: Allergy & Precautions, NPO status , Patient's Chart, lab work & pertinent test results  History of Anesthesia Complications Negative for: history of anesthetic complications  Airway Mallampati: I  TM Distance: >3 FB Neck ROM: Full    Dental  (+) Dental Advisory Given   Pulmonary neg pulmonary ROS   breath sounds clear to auscultation       Cardiovascular hypertension, Pt. on medications (-) angina  Rhythm:Regular Rate:Normal     Neuro/Psych  Headaches Back pain    GI/Hepatic Neg liver ROS,GERD  Controlled,,  Endo/Other  PCOS: metformin  Renal/GU negative Renal ROS     Musculoskeletal  (+) Arthritis ,    Abdominal   Peds  Hematology Hb 11.1, plt 348k   Anesthesia Other Findings   Reproductive/Obstetrics                              Anesthesia Physical Anesthesia Plan  ASA: 2  Anesthesia Plan: General   Post-op Pain Management: Tylenol PO (pre-op)*   Induction: Intravenous  PONV Risk Score and Plan: 3 and Ondansetron, Dexamethasone and Scopolamine patch - Pre-op  Airway Management Planned: Oral ETT  Additional Equipment: None  Intra-op Plan:   Post-operative Plan: Extubation in OR  Informed Consent: I have reviewed the patients History and Physical, chart, labs and discussed the procedure including the risks, benefits and alternatives for the proposed anesthesia with the patient or authorized representative who has indicated his/her understanding and acceptance.     Dental advisory given  Plan Discussed with: CRNA and Surgeon  Anesthesia Plan Comments:         Anesthesia Quick Evaluation

## 2022-10-06 NOTE — Op Note (Signed)
PATIENT: Shelly Myers  DAY OF SURGERY: 10/06/22   PRE-OPERATIVE DIAGNOSIS:  Isthmic spondylolisthesis   POST-OPERATIVE DIAGNOSIS:  Same   PROCEDURE:  L4-5, L5-S1 minimally invasive transforaminal lumbar interbody fusion, use of frameless stereotaxy and intra-operative CT scan   SURGEON:  Surgeon(s) and Role:    Jadene Pierini, MD    Emilee Hero PA   ANESTHESIA: ETGA   BRIEF HISTORY: This is a 57 year old woman who presented with progressive low back pain. The patient was found to have an atypical L5-S1 segment with a spondylolisthesis and a unilateral hypoplastic pedicle with the L4 IAP spanning down to S1. She responded well to MBBs at 4-5/5-1 but not RFA, I therefore recommended MIS fusion at those levels.    OPERATIVE DETAIL:  The patient was taken to the operating room and placed on the OR table in the prone position. A formal time out was performed with two patient identifiers and confirmed the operative site. Anesthesia was induced by the anesthesia team. The operative site was marked, hair was clipped with surgical clippers, the area was then prepped and draped in a sterile fashion.   A small incision was made over the PSIS and a percutaneous hip pain was placed into the pelvis and connected to a reference array for use with frameless navigation. The field was covered and the O-arm was brought into the field. An intraoperative CT was obtained and transferred to the Stealth station, which was registered to the patient's anatomy using the registration frame. The fit appeared to be acceptable. Stereotactic spinal navigation was utilized throughout the procedure for planning and placement of pedicle screw trajectories. The pedicles were marked and used to create skin incisions bilaterally. Contralateral to the TLIF, percutaneous pedicle screws were placed at L4, L5, and S1 using stereotactic navigation. These were placed by creating a navigated pilot hole then using a navigated  awl-tap, palpated, and the screws were placed. Ipsilateral to the TLIF, screws trajectories were placed at L4 and S1, skipping the hypoplastic pedicle at L5 but screws were not placed to keep the screw heads from obscuring visualization during the TLIF.  Using stereotactic guidance, a Met[employer] tube was then docked to the left L5-S1 facet and the TLIF was performed.  A left L5-S1 facetectomy was then performed and the left L5 nerve root was decompressed along its entire course. The disc space was identified, incised, and a discectomy was performed in the standard fashion. Using navigated instruments, the endplates were prepped, bone graft was packed into the disc space, and an expandable cage ([employer]) was packed with autograft and placed into the disc space using navigation.   The tube was then repositioned to approach L4-5, where a facetectomy and TLIF were again performed in the same fashion as above. The tube was then removed and hemostasis was obtained during its removal. The pedicle screws were then placed into the left side. Using the towers, rods were placed and reduced into the tulips bilaterally, caps were placed and torqued, and a final xray was taken to confirm position.  Alli Consentino PA was scrubbed and assisted with the entire procedure which included exposure, decompression, placement of hardware, and closure.  Hemostasis was again confirmed for both incisions, they were copiously irrigated, and then closed in layers.    EBL:    DRAINS: none   SPECIMENS: none   Jadene Pierini, MD

## 2022-10-06 NOTE — Anesthesia Procedure Notes (Signed)
Procedure Name: Intubation Date/Time: 10/06/2022 1:50 PM  Performed by: Darlina Guys, CRNAPre-anesthesia Checklist: Patient identified Patient Re-evaluated:Patient Re-evaluated prior to induction Oxygen Delivery Method: Circle system utilized, Simple face mask, Non-rebreather mask and Nasal cannula Preoxygenation: Pre-oxygenation with 100% oxygen Induction Type: IV induction Ventilation: Mask ventilation without difficulty Laryngoscope Size: Mac and 3 Grade View: Grade I Tube type: Oral Tube size: 7.0 mm Number of attempts: 1 Airway Equipment and Method: Stylet and Bite block Placement Confirmation: ETT inserted through vocal cords under direct vision and positive ETCO2 Secured at: 21 cm Tube secured with: Tape

## 2022-10-06 NOTE — Anesthesia Postprocedure Evaluation (Signed)
Anesthesia Post Note  Patient: Shelly Myers  Procedure(s) Performed: Lumbar Four-Lumbar Five, Lumbar Five-Sacral One Minimally Invasive Transforaminal Lumbar Interbody Fusion (Spine Lumbar) Application of O-Arm     Patient location during evaluation: PACU Anesthesia Type: General Level of consciousness: awake and alert, patient cooperative and oriented Pain management: pain level controlled Vital Signs Assessment: post-procedure vital signs reviewed and stable Respiratory status: spontaneous breathing, nonlabored ventilation and respiratory function stable Cardiovascular status: blood pressure returned to baseline and stable Postop Assessment: no apparent nausea or vomiting and able to ambulate Anesthetic complications: no   No notable events documented.  Last Vitals:  Vitals:   10/06/22 1745 10/06/22 1800  BP: (!) 109/94 101/78  Pulse: 81 76  Resp: 16 10  Temp: 36.8 C   SpO2: 100% 100%    Last Pain:  Vitals:   10/06/22 1745  PainSc: 5                  Bentleigh Stankus,E. Dajana Gehrig

## 2022-10-06 NOTE — Transfer of Care (Signed)
Immediate Anesthesia Transfer of Care Note  Patient: Shelly Myers  Procedure(s) Performed: Lumbar Four-Lumbar Five, Lumbar Five-Sacral One Minimally Invasive Transforaminal Lumbar Interbody Fusion (Spine Lumbar) Application of O-Arm  Patient Location: PACU  Anesthesia Type:General  Level of Consciousness: awake and patient cooperative  Airway & Oxygen Therapy: Patient Spontanous Breathing and Patient connected to face mask oxygen  Post-op Assessment: Report given to RN and Post -op Vital signs reviewed and stable  Post vital signs: Reviewed and stable  Last Vitals:  Vitals Value Taken Time  BP 125/73 10/06/22 1701  Temp    Pulse 91 10/06/22 1704  Resp 14 10/06/22 1704  SpO2 100 % 10/06/22 1704  Vitals shown include unvalidated device data.  Last Pain:  Vitals:   10/06/22 1012  PainSc: 2       Patients Stated Pain Goal: 0 (10/06/22 1012)  Complications: No notable events documented.

## 2022-10-06 NOTE — H&P (Signed)
Surgical H&P Update  HPI: 57 y.o. with a history of back pain. Workup showed an L5-S1 isthmic spondy with a congenital malformation spanning L4-S1. No changes in health since they were last seen. Still having the above and wishes to proceed with surgery.  PMHx:  Past Medical History:  Diagnosis Date   Allergy    Arthritis    Cancer (HCC)    "skin cancer"   DDD (degenerative disc disease), cervical    Family history of adverse reaction to anesthesia    mom had "reaction" to versed- lost eye control   GERD (gastroesophageal reflux disease)    Hypertension    Osteopenia    PCOS (polycystic ovarian syndrome)    FamHx:  Family History  Problem Relation Age of Onset   Hypertension Mother    Osteopenia Mother    Diabetes Father    Skin cancer Father    Prostate cancer Father    Esophageal cancer Father    Hypertension Father    Hyperlipidemia Father    Headache Neg Hx    Migraines Neg Hx    SocHx:  reports that she has never smoked. She has never used smokeless tobacco. She reports that she does not currently use alcohol. She reports that she does not use drugs.  Physical Exam: Strength 5/5 x4 and SILTx4   Assesment/Plan: 57 y.o. woman with L4-5/L5-S1 facet abnormality with back pain responsive to MBBs, here for L4-5/L5-S1 MIS TLIFs. Risks, benefits, and alternatives discussed and the patient would like to continue with surgery.  -OR today -3C post-op  Jadene Pierini, MD 10/06/22 12:52 PM

## 2022-10-07 ENCOUNTER — Encounter (HOSPITAL_COMMUNITY): Payer: Self-pay | Admitting: Neurological Surgery

## 2022-10-07 MED ORDER — OXYCODONE HCL 10 MG PO TABS: 10.0000 mg | ORAL_TABLET | ORAL | 0 refills | Status: AC | PRN

## 2022-10-07 MED ORDER — CYCLOBENZAPRINE HCL 5 MG PO TABS: 5.0000 mg | ORAL_TABLET | Freq: Three times a day (TID) | ORAL | 0 refills | Status: DC | PRN

## 2022-10-07 NOTE — Progress Notes (Signed)
PT Cancellation Note and Discharge  Patient Details Name: Lien Kaigler MRN: [id number] DOB: [birth year]   Cancelled Treatment:    Reason Eval/Treat Not Completed: PT screened, no needs identified, will sign off. Discussed pt case with OT who reports pt is currently mobilizing at a modified independent level and does not require a formal PT evaluation at this time. PT signing off. If needs change, please reconsult.     Marylynn Pearson 10/07/2022, 10:00 AM  Conni Slipper, PT, DPT Acute Rehabilitation Services Secure Chat Preferred Office: [phone]

## 2022-10-07 NOTE — Evaluation (Signed)
Occupational Therapy Evaluation Patient Details Name: Shelly Myers MRN: [id number] DOB: 08-Feb-1966 Today's Date: 10/07/2022   History of Present Illness Evienne Hibbert is a 57 yo female who underwent L4-S1 MIS TLIF / PSF 5/7. PMHx: arthritis, cancer, DDD, GERN, HTN, osteopenia, PCOS   Clinical Impression   Leydy was evaluated s/p the above spine surgery. She is indep at baseline. Upon evaluation she was mildly limited by back pain and precautions. Overall she needed generalized supervision A for mobility and ADLs without AD. Provided cues and education on spinal precautions and compensatory techniques throughout, handout provided and pt demonstrated great recall. Pt does not require further acute OT services. Recommend d/c home with support of family.        Recommendations for follow up therapy are one component of a multi-disciplinary discharge planning process, led by the attending physician.  Recommendations may be updated based on patient status, additional functional criteria and insurance authorization.   Assistance Recommended at Discharge Set up Supervision/Assistance  Patient can return home with the following A little help with walking and/or transfers;A little help with bathing/dressing/bathroom;Assist for transportation;Help with stairs or ramp for entrance;Assistance with cooking/housework    Functional Status Assessment  Patient has had a recent decline in their functional status and demonstrates the ability to make significant improvements in function in a reasonable and predictable amount of time.  Equipment Recommendations  None recommended by OT       Precautions / Restrictions Precautions Precautions: Fall;Back Precaution Booklet Issued: Yes (comment) Restrictions Weight Bearing Restrictions: No      Mobility Bed Mobility Overal bed mobility: Needs Assistance Bed Mobility: Rolling, Sidelying to Sit Rolling: Independent Sidelying to sit: Independent        General bed mobility comments: cue for log roll    Transfers Overall transfer level: Needs assistance Equipment used: None Transfers: Sit to/from Stand Sit to Stand: Independent                  Balance Overall balance assessment: No apparent balance deficits (not formally assessed)               ADL either performed or assessed with clinical judgement   ADL Overall ADL's : Needs assistance/impaired Eating/Feeding: Independent;Sitting   Grooming: Supervision/safety;Standing   Upper Body Bathing: Independent   Lower Body Bathing: Supervison/ safety;Sit to/from stand;Cueing for compensatory techniques;Cueing for back precautions   Upper Body Dressing : Independent;Sitting   Lower Body Dressing: Supervision/safety;Cueing for compensatory techniques;Cueing for back precautions;Sit to/from stand   Toilet Transfer: Supervision/safety;Ambulation   Toileting- Clothing Manipulation and Hygiene: Supervision/safety;Sit to/from stand;Sitting/lateral lean       Functional mobility during ADLs: Supervision/safety;Cueing for safety General ADL Comments: no physical assist needed     Vision Baseline Vision/History: 0 No visual deficits Vision Assessment?: No apparent visual deficits     Perception Perception Perception Tested?: No   Praxis Praxis Praxis tested?: Not tested       Hand Dominance Right   Extremity/Trunk Assessment Upper Extremity Assessment Upper Extremity Assessment: Overall WFL for tasks assessed   Lower Extremity Assessment Lower Extremity Assessment: Overall WFL for tasks assessed   Cervical / Trunk Assessment Cervical / Trunk Assessment: Back Surgery   Communication Communication Communication: No difficulties   Cognition Arousal/Alertness: Awake/alert Behavior During Therapy: WFL for tasks assessed/performed Overall Cognitive Status: Within Functional Limits for tasks assessed                 General Comments  VSS on  RA  Home Living Family/patient expects to be discharged to:: Private residence Living Arrangements: Spouse/significant other Available Help at Discharge: Family;Available 24 hours/day Type of Home: House Home Access: Stairs to enter [employer] of Steps: 2-3 Entrance Stairs-Rails: Right;Left Home Layout: Two level;Bed/bath upstairs Alternate Level Stairs-Number of Steps: flight Alternate Level Stairs-Rails: Right;Left Bathroom Shower/Tub: Producer, television/film/video: Standard     Home Equipment: None          Prior Functioning/Environment Prior Level of Function : Independent/Modified Independent;Driving             Mobility Comments: no AD ADLs Comments: indep        OT Problem List: Decreased strength;Decreased range of motion;Decreased activity tolerance;Impaired balance (sitting and/or standing);Decreased knowledge of precautions;Decreased knowledge of use of DME or AE;Pain         OT Goals(Current goals can be found in the care plan section) Acute Rehab OT Goals Patient Stated Goal: home OT Goal Formulation: With patient Time For Goal Achievement: 10/07/22 Potential to Achieve Goals: Good   AM-PAC OT "6 Clicks" Daily Activity     Outcome Measure Help from another person eating meals?: None Help from another person taking care of personal grooming?: A Little Help from another person toileting, which includes using toliet, bedpan, or urinal?: A Little Help from another person bathing (including washing, rinsing, drying)?: A Little Help from another person to put on and taking off regular upper body clothing?: None Help from another person to put on and taking off regular lower body clothing?: A Little 6 Click Score: 20   End of Session Nurse Communication: Mobility status  Activity Tolerance: Patient tolerated treatment well Patient left: in bed;with call bell/phone within reach  OT Visit Diagnosis: Unsteadiness on feet  (R26.81);Other abnormalities of gait and mobility (R26.89);Muscle weakness (generalized) (M62.81);Pain                Time: 1610-9604 OT Time Calculation (min): 14 min Charges:  OT General Charges $OT Visit: 1 Visit OT Evaluation $OT Eval Low Complexity: 1 Low  Derenda Mis, OTR/L Acute Rehabilitation Services Office [phone] Secure Chat Communication Preferred   Donia Pounds 10/07/2022, 9:38 AM

## 2022-10-07 NOTE — Discharge Summary (Signed)
Discharge Summary  Date of Admission: 10/06/2022  Date of Discharge: 10/07/22  Attending Physician: Autumn Patty, MD  Hospital Course: Patient was admitted following an uncomplicated L4-S1 MIS TLIF / PSF. They were recovered in PACU and transferred to [facility]. Their preop symptoms were improved, their hospital course was uncomplicated and the patient was discharged home today. They will follow up in clinic with me in clinic in 2 weeks.  Neurologic exam at discharge:  Strength 5/5 x4 and SILTx4, incision c/d/I   Discharge diagnosis: Isthmic spondylolisthesis   Iran Sizer, PA-C 10/07/22 9:04 AM

## 2022-10-07 NOTE — Progress Notes (Signed)
Neurosurgery Service Progress Note  Subjective: No acute events overnight. Back pain as expected. Overall feeling well. No voiced complaints.   Objective: Vitals:   10/06/22 2021 10/06/22 2324 10/07/22 0453 10/07/22 0734  BP: 133/85 97/62 107/84 (!) 107/93  Pulse: 98 85 86 97  Resp: 18 18 18 16   Temp: 98.8 F (37.1 C) 99.2 F (37.3 C) 97.8 F (36.6 C) 98.4 F (36.9 C)  TempSrc: Oral Oral Oral Oral  SpO2: 100% 99% 100% 99%  Weight:      Height:        Physical Exam: Strength 5/5 x4 and SILTx4, incision c/d/I   Assessment & Plan: 57 y.o. female s/p L4-S1 MIS TLIF / PSF, recovering well.  -PT/OT -discharge home today   Emilee Hero, PA-C 10/07/22 9:03 AM

## 2022-10-07 NOTE — Progress Notes (Signed)
Patient alert and oriented, void, ambulate. Surgical site clean and dry no sign of infection. D/c instructions explain and given to the patient all questions answered.pt. d/c home per order.

## 2022-10-16 ENCOUNTER — Encounter (HOSPITAL_COMMUNITY): Payer: Self-pay | Admitting: Neurological Surgery

## 2022-10-29 DIAGNOSIS — G894 Chronic pain syndrome: Secondary | ICD-10-CM | POA: Diagnosis not present

## 2022-10-29 DIAGNOSIS — G43009 Migraine without aura, not intractable, without status migrainosus: Secondary | ICD-10-CM | POA: Diagnosis not present

## 2022-10-29 DIAGNOSIS — M62830 Muscle spasm of back: Secondary | ICD-10-CM | POA: Diagnosis not present

## 2022-10-29 DIAGNOSIS — Z79891 Long term (current) use of opiate analgesic: Secondary | ICD-10-CM | POA: Diagnosis not present

## 2022-10-29 DIAGNOSIS — M47812 Spondylosis without myelopathy or radiculopathy, cervical region: Secondary | ICD-10-CM | POA: Diagnosis not present

## 2022-11-26 DIAGNOSIS — G43009 Migraine without aura, not intractable, without status migrainosus: Secondary | ICD-10-CM | POA: Diagnosis not present

## 2022-11-26 DIAGNOSIS — M62830 Muscle spasm of back: Secondary | ICD-10-CM | POA: Diagnosis not present

## 2022-11-26 DIAGNOSIS — M47812 Spondylosis without myelopathy or radiculopathy, cervical region: Secondary | ICD-10-CM | POA: Diagnosis not present

## 2022-11-26 DIAGNOSIS — G894 Chronic pain syndrome: Secondary | ICD-10-CM | POA: Diagnosis not present

## 2022-12-28 DIAGNOSIS — M62830 Muscle spasm of back: Secondary | ICD-10-CM | POA: Diagnosis not present

## 2022-12-28 DIAGNOSIS — M47812 Spondylosis without myelopathy or radiculopathy, cervical region: Secondary | ICD-10-CM | POA: Diagnosis not present

## 2022-12-28 DIAGNOSIS — G894 Chronic pain syndrome: Secondary | ICD-10-CM | POA: Diagnosis not present

## 2022-12-28 DIAGNOSIS — G43009 Migraine without aura, not intractable, without status migrainosus: Secondary | ICD-10-CM | POA: Diagnosis not present

## 2023-01-14 DIAGNOSIS — F3342 Major depressive disorder, recurrent, in full remission: Secondary | ICD-10-CM | POA: Diagnosis not present

## 2023-01-26 DIAGNOSIS — G43009 Migraine without aura, not intractable, without status migrainosus: Secondary | ICD-10-CM | POA: Diagnosis not present

## 2023-01-26 DIAGNOSIS — M62830 Muscle spasm of back: Secondary | ICD-10-CM | POA: Diagnosis not present

## 2023-01-26 DIAGNOSIS — M47812 Spondylosis without myelopathy or radiculopathy, cervical region: Secondary | ICD-10-CM | POA: Diagnosis not present

## 2023-01-26 DIAGNOSIS — G894 Chronic pain syndrome: Secondary | ICD-10-CM | POA: Diagnosis not present

## 2023-03-25 DIAGNOSIS — M47812 Spondylosis without myelopathy or radiculopathy, cervical region: Secondary | ICD-10-CM | POA: Diagnosis not present

## 2023-03-25 DIAGNOSIS — G43009 Migraine without aura, not intractable, without status migrainosus: Secondary | ICD-10-CM | POA: Diagnosis not present

## 2023-03-25 DIAGNOSIS — M62830 Muscle spasm of back: Secondary | ICD-10-CM | POA: Diagnosis not present

## 2023-03-25 DIAGNOSIS — G894 Chronic pain syndrome: Secondary | ICD-10-CM | POA: Diagnosis not present

## 2023-04-19 DIAGNOSIS — N764 Abscess of vulva: Secondary | ICD-10-CM | POA: Diagnosis not present

## 2023-04-19 DIAGNOSIS — Z1151 Encounter for screening for human papillomavirus (HPV): Secondary | ICD-10-CM | POA: Diagnosis not present

## 2023-04-19 DIAGNOSIS — Z01419 Encounter for gynecological examination (general) (routine) without abnormal findings: Secondary | ICD-10-CM | POA: Diagnosis not present

## 2023-04-19 DIAGNOSIS — Z124 Encounter for screening for malignant neoplasm of cervix: Secondary | ICD-10-CM | POA: Diagnosis not present

## 2023-04-22 DIAGNOSIS — G43009 Migraine without aura, not intractable, without status migrainosus: Secondary | ICD-10-CM | POA: Diagnosis not present

## 2023-04-22 DIAGNOSIS — M7551 Bursitis of right shoulder: Secondary | ICD-10-CM | POA: Diagnosis not present

## 2023-04-22 DIAGNOSIS — G894 Chronic pain syndrome: Secondary | ICD-10-CM | POA: Diagnosis not present

## 2023-04-22 DIAGNOSIS — M47812 Spondylosis without myelopathy or radiculopathy, cervical region: Secondary | ICD-10-CM | POA: Diagnosis not present

## 2023-04-22 DIAGNOSIS — M62830 Muscle spasm of back: Secondary | ICD-10-CM | POA: Diagnosis not present

## 2023-06-14 DIAGNOSIS — R051 Acute cough: Secondary | ICD-10-CM | POA: Diagnosis not present

## 2023-06-14 DIAGNOSIS — J0110 Acute frontal sinusitis, unspecified: Secondary | ICD-10-CM | POA: Diagnosis not present

## 2023-06-16 DIAGNOSIS — G894 Chronic pain syndrome: Secondary | ICD-10-CM | POA: Diagnosis not present

## 2023-06-16 DIAGNOSIS — G43009 Migraine without aura, not intractable, without status migrainosus: Secondary | ICD-10-CM | POA: Diagnosis not present

## 2023-06-16 DIAGNOSIS — M62830 Muscle spasm of back: Secondary | ICD-10-CM | POA: Diagnosis not present

## 2023-06-16 DIAGNOSIS — M47812 Spondylosis without myelopathy or radiculopathy, cervical region: Secondary | ICD-10-CM | POA: Diagnosis not present

## 2023-07-19 DIAGNOSIS — F3342 Major depressive disorder, recurrent, in full remission: Secondary | ICD-10-CM | POA: Diagnosis not present

## 2023-07-21 DIAGNOSIS — M47812 Spondylosis without myelopathy or radiculopathy, cervical region: Secondary | ICD-10-CM | POA: Diagnosis not present

## 2023-07-21 DIAGNOSIS — G43009 Migraine without aura, not intractable, without status migrainosus: Secondary | ICD-10-CM | POA: Diagnosis not present

## 2023-07-21 DIAGNOSIS — M62830 Muscle spasm of back: Secondary | ICD-10-CM | POA: Diagnosis not present

## 2023-07-21 DIAGNOSIS — G894 Chronic pain syndrome: Secondary | ICD-10-CM | POA: Diagnosis not present

## 2023-08-02 ENCOUNTER — Encounter: Payer: Self-pay | Admitting: Internal Medicine

## 2023-08-02 ENCOUNTER — Ambulatory Visit: Payer: BC Managed Care – PPO | Admitting: Internal Medicine

## 2023-08-02 VITALS — BP 110/70 | HR 104 | Ht 61.0 in | Wt 104.6 lb

## 2023-08-02 DIAGNOSIS — E673 Hypervitaminosis D: Secondary | ICD-10-CM

## 2023-08-02 DIAGNOSIS — M8580 Other specified disorders of bone density and structure, unspecified site: Secondary | ICD-10-CM | POA: Diagnosis not present

## 2023-08-02 DIAGNOSIS — R7989 Other specified abnormal findings of blood chemistry: Secondary | ICD-10-CM

## 2023-08-02 DIAGNOSIS — E785 Hyperlipidemia, unspecified: Secondary | ICD-10-CM

## 2023-08-02 DIAGNOSIS — E282 Polycystic ovarian syndrome: Secondary | ICD-10-CM

## 2023-08-02 MED ORDER — METFORMIN HCL ER 500 MG PO TB24
1500.0000 mg | ORAL_TABLET | Freq: Every day | ORAL | 3 refills | Status: AC
Start: 2023-08-02 — End: ?

## 2023-08-02 NOTE — Progress Notes (Unsigned)
 Name: Shelly Myers  MRN/ DOB: [id number], 1965-11-25    Age/ Sex: 58 y.o., female     PCP: Wilfrid Lund, PA   Reason for Endocrinology Evaluation: Hypervitaminosis D     Initial Endocrinology Clinic Visit: 04/17/2019    PATIENT IDENTIFIER: Shelly Myers is a 58 y.o., female with a past medical history of Hx of PCOS, migraine headaches and insomnia. She has followed with [facility] since 04/17/2019 for consultative assistance with management of her hypervitaminosis.   HISTORICAL SUMMARY:  Pt has been referred by Dr. Dion Body for elevated Vitamin D levels > 120 ng/dL . This was attributed to excessive tanning.    Pt has been diagnosed with PCOS in [place] , was following with Endo there. Has been on Metformin for years.  Has 1 child without difficulty conceiving   DXA 08/14/2019 low bone density - T score -1.1 at the right femoral  neck .Mother with osteopenia / osteoporosis but no hip fracture  Menarche at age 41 . Was on OCP's until the age 86 yrs.     SUBJECTIVE:    Today (08/02/2023):  Shelly Myers is here for a follow up on hypervitaminosis D, PCOS   Follows with dermatology for skin cancer  Weight stable  Denies nausea or vomiting  Chronic constipation is improving  Denies polyuria and polydipsia  Denies renal stones.  She has not been tanning    Metformin 500 mg, TID  Calcium 500 mg twice daily- not taking  Atorvastatin 10 mg daily- not taking     HISTORY:  Past Medical History:  Past Medical History:  Diagnosis Date   Allergy    Arthritis    Cancer (HCC)    "skin cancer"   DDD (degenerative disc disease), cervical    Family history of adverse reaction to anesthesia    mom had "reaction" to versed- lost eye control   GERD (gastroesophageal reflux disease)    Hypertension    Osteopenia    PCOS (polycystic ovarian syndrome)    Past Surgical History:  Social History:  reports that she has never smoked. She has never used  smokeless tobacco. She reports that she does not currently use alcohol. She reports that she does not use drugs. Family History:  Family History  Problem Relation Age of Onset   Hypertension Mother    Osteopenia Mother    Diabetes Father    Skin cancer Father    Prostate cancer Father    Esophageal cancer Father    Hypertension Father    Hyperlipidemia Father    Headache Neg Hx    Migraines Neg Hx      HOME MEDICATIONS: Allergies as of 08/02/2023   No Known Allergies      Medication List        Accurate as of August 02, 2023  1:44 PM. If you have any questions, ask your nurse or doctor.          Aimovig 140 MG/ML Soaj Generic drug: Erenumab-aooe Inject into the skin every 30 (thirty) days.   ALPRAZolam 1 MG tablet Commonly known as: XANAX Take 3 mg by mouth at bedtime.   atorvastatin 10 MG tablet Commonly known as: LIPITOR Take 1 tablet (10 mg total) by mouth daily.   clindamycin 1 % external solution Commonly known as: CLEOCIN T 3 times/day as needed-between meals & bedtime.   cyclobenzaprine 5 MG tablet Commonly known as: FLEXERIL Take 1 tablet (5 mg total) by mouth 3 (three) times  daily as needed for muscle spasms.   cyclobenzaprine 10 MG tablet Commonly known as: FLEXERIL Take 10 mg by mouth 3 (three) times daily.   EPINEPHrine 0.3 mg/0.3 mL Soaj injection Commonly known as: EPI-PEN Inject 0.3 mg into the muscle as needed for anaphylaxis.   fexofenadine-pseudoephedrine 60-120 MG 12 hr tablet Commonly known as: ALLEGRA-D Take 1 tablet by mouth 2 (two) times daily.   guaiFENesin-codeine 100-10 MG/5ML syrup Take 5 mLs by mouth every 6 (six) hours as needed.   metFORMIN 500 MG 24 hr tablet Commonly known as: GLUCOPHAGE-XR Take 3 tablets (1,500 mg total) by mouth daily.   metoprolol succinate 25 MG 24 hr tablet Commonly known as: TOPROL-XL Take 25 mg by mouth daily.   multivitamin with minerals tablet Take 1 tablet by mouth daily.   Oxycodone  HCl 10 MG Tabs Take 1 tablet (10 mg total) by mouth every 4 (four) hours as needed for severe pain ((score 7 to 10)).   oxyCODONE-acetaminophen 10-325 MG tablet Commonly known as: PERCOCET Take 1 tablet by mouth 5 (five) times daily as needed.   Prempro 0.45-1.5 MG tablet Generic drug: estrogen (conjugated)-medroxyprogesterone Take 1 tablet by mouth daily.   rizatriptan 10 MG disintegrating tablet Commonly known as: MAXALT-MLT Take 10 mg by mouth daily as needed for migraine.   traZODone 100 MG tablet Commonly known as: DESYREL Take 400 mg by mouth at bedtime.   triamcinolone cream 0.1 % Commonly known as: KENALOG Apply 1 Application topically 2 (two) times daily as needed (Skin cancer on chest).   Trintellix 20 MG Tabs tablet Generic drug: vortioxetine HBr Take 20 mg by mouth daily.   Yuvafem 10 MCG Tabs vaginal tablet Generic drug: Estradiol Place 1 tablet vaginally 2 (two) times a week.          OBJECTIVE:   PHYSICAL EXAM: VS: BP 110/70 (BP Location: Left Arm, Patient Position: Sitting, Cuff Size: Small)   Pulse (!) 104   Ht 5\' 1"  (1.549 m)   Wt 104 lb 9.6 oz (47.4 kg)   SpO2 98%   BMI 19.76 kg/m    EXAM: General: Pt appears well and is in NAD  Neck: General: Supple without adenopathy. Thyroid: Thyroid size normal.  No goiter or nodules appreciated.  Lungs: Clear with good BS bilat with no rales, rhonchi, or wheezes  Heart: Auscultation: RRR.  Abdomen: Normoactive bowel sounds, soft, nontender, without masses or organomegaly palpable  Extremities:  BL LE: No pretibial edema  Skin: Evidence of tanning again noted   Mental Status: Judgment, insight: Intact Orientation: Oriented to time, place, and person Mood and affect: No depression, anxiety, or agitation     DATA REVIEWED:  ***  DXA 08/19/2021 Results:   Lumbar spine L1-L4 (L2, L3) Femoral neck (FN) 33% distal radius  T-score -0.6 RFN: -1.2 LFN: -1.0 n/a  Change in BMD from previous DXA  test (%)  -2.7%  -2.2% n/a  (*) statistically significant   Assessment: the BMD is low according to the [facility] classification for osteoporosis (see below).  ASSESSMENT / PLAN / RECOMMENDATIONS:   1. PCOS :     -The main issue with PCOS in the postmenopausal years, is hyperandrogenism , which she has no symptoms or sign of at this time. -Postmenopausal woman with PCOS are at a higher risk for developing type 2 diabetes, cardiovascular disease, and other metabolic disorders.  Our focus moving forward should be on her metabolic syndromes.  - A1c *** - Tolerating Metformin, no change  Medication  Continue Metformin 500 mg TID   2.  Low bone density:   -DXA in 07/2021 showed  low bone density  - Emphasized importance of calcium intake as well as weightbearing exercises -I had asked her in the past to start taking calcium tablets but she has not done so.  -Patient believes she consumes enough calcium through her diet -She is scheduled for repeat DXA scan on March 24th   3. Dyslipidemia :   -In the past I have advised her to start fish oil but she did not -I have also prescribed atorvastatin in the past, but she never started it -We discussed the cardiovascular and CVA risk with dyslipidemia -Discussed low-fat diet as well as regular exercise    4. Hypervitaminsosis D:    -  Unfortunately this is high again, she is back to tanning.  - The pt has been warned about the effects of hypervitaminosis in increasing risk of dehydration, CKD, nephrolithiasis etc  - She is not on any OTC vitamin D supplements - Other than advising her to stop tanning and complications of hypervitaminosis , I am not sure what else I can do.    Follow-up in 1 yr       Signed electronically by: Lyndle Herrlich, MD  [facility]  [facility] [address] [place] [place], [place] [address] Phone: [phone] FAX: [phone]      CC: Wilfrid Lund,  [place] [address] [place] [address] Phone: [phone]  Fax: [phone]   Return to Endocrinology clinic as below: Future Appointments  Date Time Provider Department Center  08/23/2023 10:00 AM [facility] DEXA 1 [facility]  08/01/2024  1:00 PM Kaydense Rizo, Konrad Dolores, MD [facility] None

## 2023-08-03 ENCOUNTER — Telehealth: Payer: Self-pay | Admitting: Internal Medicine

## 2023-08-03 LAB — LIPID PANEL
Cholesterol: 183 mg/dL (ref <200)
HDL: 72 mg/dL (ref >=50)
LDL Cholesterol (Calc): 88 mg/dL
Non-HDL Cholesterol (Calc): 111 mg/dL (ref <130)
Total CHOL/HDL Ratio: 2.5 (calc) (ref <5.0)
Triglycerides: 134 mg/dL (ref <150)

## 2023-08-03 LAB — VITAMIN D 25 HYDROXY (VIT D DEFICIENCY, FRACTURES): Vit D, 25-Hydroxy: 72 ng/mL (ref 30–100)

## 2023-08-03 LAB — BASIC METABOLIC PANEL
BUN/Creatinine Ratio: 16 (calc) (ref 6–22)
BUN: 17 mg/dL (ref 7–25)
CO2: 27 mmol/L (ref 20–32)
Calcium: 9.4 mg/dL (ref 8.6–10.4)
Chloride: 105 mmol/L (ref 98–110)
Creat: 1.05 mg/dL — ABNORMAL HIGH (ref 0.50–1.03)
Glucose, Bld: 104 mg/dL — ABNORMAL HIGH (ref 65–99)
Potassium: 4.5 mmol/L (ref 3.5–5.3)
Sodium: 141 mmol/L (ref 135–146)

## 2023-08-03 LAB — ALBUMIN: Albumin: 4.1 g/dL (ref 3.6–5.1)

## 2023-08-03 LAB — HEMOGLOBIN A1C
Hgb A1c MFr Bld: 5.1 %{Hb} (ref <5.7)
Mean Plasma Glucose: 100 mg/dL
eAG (mmol/L): 5.5 mmol/L

## 2023-08-03 LAB — T4, FREE: Free T4: 1.1 ng/dL (ref 0.8–1.8)

## 2023-08-03 LAB — TSH: TSH: 0.21 m[IU]/L — ABNORMAL LOW (ref 0.40–4.50)

## 2023-08-03 NOTE — Telephone Encounter (Signed)
 Patient aware and will stop taking the Biotin and has been scheduled for 2 month labs

## 2023-08-03 NOTE — Telephone Encounter (Signed)
 Please let the patient know that her thyroid test results came back not normal showing that she is on too much thyroid hormone.   Please make sure the patient is not taking any biotin or any supplements that may say good for hair skin and nails.  She will need to stop this at this time    Cholesterol looks great, A1c is normal at 5.1% and kidney function and vitamin D are also normal   Please schedule the patient for repeat thyroid test in 2 months   Thanks

## 2023-08-18 DIAGNOSIS — M62830 Muscle spasm of back: Secondary | ICD-10-CM | POA: Diagnosis not present

## 2023-08-18 DIAGNOSIS — G43009 Migraine without aura, not intractable, without status migrainosus: Secondary | ICD-10-CM | POA: Diagnosis not present

## 2023-08-18 DIAGNOSIS — M47812 Spondylosis without myelopathy or radiculopathy, cervical region: Secondary | ICD-10-CM | POA: Diagnosis not present

## 2023-08-18 DIAGNOSIS — G894 Chronic pain syndrome: Secondary | ICD-10-CM | POA: Diagnosis not present

## 2023-08-23 ENCOUNTER — Other Ambulatory Visit

## 2023-08-23 ENCOUNTER — Ambulatory Visit (INDEPENDENT_AMBULATORY_CARE_PROVIDER_SITE_OTHER)
Admission: RE | Admit: 2023-08-23 | Discharge: 2023-08-23 | Disposition: A | Discharge status: Home / Self Care | Source: Ambulatory Visit | Attending: Internal Medicine | Admitting: Internal Medicine

## 2023-08-23 DIAGNOSIS — M8580 Other specified disorders of bone density and structure, unspecified site: Secondary | ICD-10-CM | POA: Diagnosis not present

## 2023-08-27 ENCOUNTER — Telehealth: Payer: Self-pay

## 2023-08-27 ENCOUNTER — Telehealth: Payer: Self-pay | Admitting: Internal Medicine

## 2023-08-27 MED ORDER — ALENDRONATE SODIUM 70 MG PO TABS
70.0000 mg | ORAL_TABLET | ORAL | 3 refills | Status: DC
Start: 2023-08-27 — End: 2024-02-21

## 2023-08-27 NOTE — Addendum Note (Signed)
 Addended by: Scarlette Shorts on: 08/27/2023 03:46 PM   Modules accepted: Orders

## 2023-08-27 NOTE — Telephone Encounter (Signed)
Patient call back for results.

## 2023-08-27 NOTE — Telephone Encounter (Signed)
 Attempted to contact the pt on 08/27/2023 at 1230 . Left a voice mail for a call back to discuss DXA scan results     Abby Raelyn Mora, MD  [facility]  [facility] [address] [place], [place] [address] Phone: [phone] FAX: [phone]

## 2023-08-27 NOTE — Telephone Encounter (Signed)
 Discussed DXA scan results with the pt on 08/27/2023 at 1530  I have recommended calcium 600 mg BID  Weight bearing exercise and bisphosphanate  We did discuss the risk of GERD with oral bisphosphonate therapy, I also give the patient option to proceed with Reclast infusion.  Patient would like to try alendronate 70 mg once weekly

## 2023-09-01 DIAGNOSIS — C44712 Basal cell carcinoma of skin of right lower limb, including hip: Secondary | ICD-10-CM | POA: Diagnosis not present

## 2023-09-01 DIAGNOSIS — C44529 Squamous cell carcinoma of skin of other part of trunk: Secondary | ICD-10-CM | POA: Diagnosis not present

## 2023-09-01 DIAGNOSIS — L570 Actinic keratosis: Secondary | ICD-10-CM | POA: Diagnosis not present

## 2023-09-01 DIAGNOSIS — L986 Other infiltrative disorders of the skin and subcutaneous tissue: Secondary | ICD-10-CM | POA: Diagnosis not present

## 2023-09-01 DIAGNOSIS — D485 Neoplasm of uncertain behavior of skin: Secondary | ICD-10-CM | POA: Diagnosis not present

## 2023-09-08 DIAGNOSIS — L570 Actinic keratosis: Secondary | ICD-10-CM | POA: Diagnosis not present

## 2023-09-13 DIAGNOSIS — C44521 Squamous cell carcinoma of skin of breast: Secondary | ICD-10-CM | POA: Diagnosis not present

## 2023-09-13 DIAGNOSIS — D045 Carcinoma in situ of skin of trunk: Secondary | ICD-10-CM | POA: Diagnosis not present

## 2023-09-15 DIAGNOSIS — G894 Chronic pain syndrome: Secondary | ICD-10-CM | POA: Diagnosis not present

## 2023-09-15 DIAGNOSIS — M62830 Muscle spasm of back: Secondary | ICD-10-CM | POA: Diagnosis not present

## 2023-09-15 DIAGNOSIS — Z79891 Long term (current) use of opiate analgesic: Secondary | ICD-10-CM | POA: Diagnosis not present

## 2023-09-15 DIAGNOSIS — M47812 Spondylosis without myelopathy or radiculopathy, cervical region: Secondary | ICD-10-CM | POA: Diagnosis not present

## 2023-09-15 DIAGNOSIS — G43009 Migraine without aura, not intractable, without status migrainosus: Secondary | ICD-10-CM | POA: Diagnosis not present

## 2023-09-21 DIAGNOSIS — M47812 Spondylosis without myelopathy or radiculopathy, cervical region: Secondary | ICD-10-CM | POA: Diagnosis not present

## 2023-09-21 DIAGNOSIS — M47816 Spondylosis without myelopathy or radiculopathy, lumbar region: Secondary | ICD-10-CM | POA: Diagnosis not present

## 2023-09-22 DIAGNOSIS — M47816 Spondylosis without myelopathy or radiculopathy, lumbar region: Secondary | ICD-10-CM | POA: Diagnosis not present

## 2023-09-22 DIAGNOSIS — M47812 Spondylosis without myelopathy or radiculopathy, cervical region: Secondary | ICD-10-CM | POA: Diagnosis not present

## 2023-09-23 DIAGNOSIS — M47816 Spondylosis without myelopathy or radiculopathy, lumbar region: Secondary | ICD-10-CM | POA: Diagnosis not present

## 2023-09-28 DIAGNOSIS — D485 Neoplasm of uncertain behavior of skin: Secondary | ICD-10-CM | POA: Diagnosis not present

## 2023-09-28 DIAGNOSIS — C44519 Basal cell carcinoma of skin of other part of trunk: Secondary | ICD-10-CM | POA: Diagnosis not present

## 2023-09-28 DIAGNOSIS — C44712 Basal cell carcinoma of skin of right lower limb, including hip: Secondary | ICD-10-CM | POA: Diagnosis not present

## 2023-10-05 ENCOUNTER — Other Ambulatory Visit

## 2023-10-05 DIAGNOSIS — R7989 Other specified abnormal findings of blood chemistry: Secondary | ICD-10-CM | POA: Diagnosis not present

## 2023-10-06 LAB — T4, FREE: Free T4: 1.3 ng/dL (ref 0.8–1.8)

## 2023-10-06 LAB — TSH: TSH: 0.42 m[IU]/L (ref 0.40–4.50)

## 2023-10-06 LAB — T3, FREE: T3, Free: 3 pg/mL (ref 2.3–4.2)

## 2023-10-07 ENCOUNTER — Encounter: Payer: Self-pay | Admitting: Internal Medicine

## 2023-10-12 DIAGNOSIS — C44529 Squamous cell carcinoma of skin of other part of trunk: Secondary | ICD-10-CM | POA: Diagnosis not present

## 2023-10-12 DIAGNOSIS — D485 Neoplasm of uncertain behavior of skin: Secondary | ICD-10-CM | POA: Diagnosis not present

## 2023-10-12 DIAGNOSIS — L905 Scar conditions and fibrosis of skin: Secondary | ICD-10-CM | POA: Diagnosis not present

## 2023-10-12 DIAGNOSIS — C44521 Squamous cell carcinoma of skin of breast: Secondary | ICD-10-CM | POA: Diagnosis not present

## 2023-10-14 DIAGNOSIS — M62830 Muscle spasm of back: Secondary | ICD-10-CM | POA: Diagnosis not present

## 2023-10-14 DIAGNOSIS — G43009 Migraine without aura, not intractable, without status migrainosus: Secondary | ICD-10-CM | POA: Diagnosis not present

## 2023-10-14 DIAGNOSIS — G894 Chronic pain syndrome: Secondary | ICD-10-CM | POA: Diagnosis not present

## 2023-10-14 DIAGNOSIS — M47812 Spondylosis without myelopathy or radiculopathy, cervical region: Secondary | ICD-10-CM | POA: Diagnosis not present

## 2023-10-21 DIAGNOSIS — M47812 Spondylosis without myelopathy or radiculopathy, cervical region: Secondary | ICD-10-CM | POA: Diagnosis not present

## 2023-10-21 DIAGNOSIS — M47816 Spondylosis without myelopathy or radiculopathy, lumbar region: Secondary | ICD-10-CM | POA: Diagnosis not present

## 2023-10-22 DIAGNOSIS — M47816 Spondylosis without myelopathy or radiculopathy, lumbar region: Secondary | ICD-10-CM | POA: Diagnosis not present

## 2023-10-22 DIAGNOSIS — M47812 Spondylosis without myelopathy or radiculopathy, cervical region: Secondary | ICD-10-CM | POA: Diagnosis not present

## 2023-10-26 DIAGNOSIS — C519 Malignant neoplasm of vulva, unspecified: Secondary | ICD-10-CM | POA: Diagnosis not present

## 2023-11-09 DIAGNOSIS — C44521 Squamous cell carcinoma of skin of breast: Secondary | ICD-10-CM | POA: Diagnosis not present

## 2023-11-10 DIAGNOSIS — M47812 Spondylosis without myelopathy or radiculopathy, cervical region: Secondary | ICD-10-CM | POA: Diagnosis not present

## 2023-11-10 DIAGNOSIS — G43009 Migraine without aura, not intractable, without status migrainosus: Secondary | ICD-10-CM | POA: Diagnosis not present

## 2023-11-10 DIAGNOSIS — G894 Chronic pain syndrome: Secondary | ICD-10-CM | POA: Diagnosis not present

## 2023-11-10 DIAGNOSIS — M62830 Muscle spasm of back: Secondary | ICD-10-CM | POA: Diagnosis not present

## 2023-11-21 DIAGNOSIS — M47812 Spondylosis without myelopathy or radiculopathy, cervical region: Secondary | ICD-10-CM | POA: Diagnosis not present

## 2023-11-21 DIAGNOSIS — M47816 Spondylosis without myelopathy or radiculopathy, lumbar region: Secondary | ICD-10-CM | POA: Diagnosis not present

## 2023-11-22 DIAGNOSIS — M47812 Spondylosis without myelopathy or radiculopathy, cervical region: Secondary | ICD-10-CM | POA: Diagnosis not present

## 2023-11-22 DIAGNOSIS — M47816 Spondylosis without myelopathy or radiculopathy, lumbar region: Secondary | ICD-10-CM | POA: Diagnosis not present

## 2023-11-24 ENCOUNTER — Encounter: Payer: Self-pay | Admitting: Physical Therapy

## 2023-11-24 ENCOUNTER — Ambulatory Visit (HOSPITAL_BASED_OUTPATIENT_CLINIC_OR_DEPARTMENT_OTHER)

## 2023-11-24 ENCOUNTER — Ambulatory Visit (INDEPENDENT_AMBULATORY_CARE_PROVIDER_SITE_OTHER): Admitting: Physical Therapy

## 2023-11-24 ENCOUNTER — Ambulatory Visit (HOSPITAL_BASED_OUTPATIENT_CLINIC_OR_DEPARTMENT_OTHER): Admitting: Orthopaedic Surgery

## 2023-11-24 DIAGNOSIS — M25511 Pain in right shoulder: Secondary | ICD-10-CM | POA: Diagnosis not present

## 2023-11-24 DIAGNOSIS — M19011 Primary osteoarthritis, right shoulder: Secondary | ICD-10-CM | POA: Diagnosis not present

## 2023-11-24 DIAGNOSIS — M778 Other enthesopathies, not elsewhere classified: Secondary | ICD-10-CM | POA: Diagnosis not present

## 2023-11-24 MED ORDER — LIDOCAINE HCL 1 % IJ SOLN
4.0000 mL | INTRAMUSCULAR | Status: AC | PRN
Start: 2023-11-24 — End: 2023-11-24
  Administered 2023-11-24: 4 mL

## 2023-11-24 MED ORDER — TRIAMCINOLONE ACETONIDE 40 MG/ML IJ SUSP
80.0000 mg | INTRAMUSCULAR | Status: AC | PRN
Start: 2023-11-24 — End: 2023-11-24
  Administered 2023-11-24: 80 mg via INTRA_ARTICULAR

## 2023-11-24 NOTE — Therapy (Signed)
  OUTPATIENT PHYSICAL THERAPY SCREEN @[address]   Patient Name: Shelly Myers MRN: [id number] DOB:03/26/1966, 58 y.o., female Today's Date: 11/24/2023  END OF SESSION:  PT End of Session - 11/24/23 1121     Visit Number 1    Activity Tolerance Patient tolerated treatment well          Past Medical History:  Diagnosis Date   Allergy    Arthritis    Cancer (HCC)    skin cancer   DDD (degenerative disc disease), cervical    Family history of adverse reaction to anesthesia    mom had reaction to versed - lost eye control   GERD (gastroesophageal reflux disease)    Hypertension    Osteopenia    PCOS (polycystic ovarian syndrome)    Past Surgical History:  Procedure Laterality Date   BREAST ENHANCEMENT SURGERY     DILATATION & CURETTAGE/HYSTEROSCOPY WITH MYOSURE N/A 08/13/2021   Procedure: DILATATION & CURETTAGE/HYSTEROSCOPY WITH MYOSURE;  Surgeon: Storm Setter, DO;  Location: [facility];  Service: Gynecology;  Laterality: N/A;   HAND SURGERY Bilateral    TONSILLECTOMY     TRANSFORAMINAL LUMBAR INTERBODY FUSION W/ MIS 2 LEVEL N/A 10/06/2022   Procedure: Lumbar Four-Lumbar Five, Lumbar Five-Sacral One Minimally Invasive Transforaminal Lumbar Interbody Fusion;  Surgeon: Cheryle Debby LABOR, MD;  Location: [facility] OR;  Service: Neurosurgery;  Laterality: N/A;  RM 20 to follow   Patient Active Problem List   Diagnosis Date Noted   Isthmic spondylolisthesis 10/06/2022   Dyslipidemia 07/30/2021   NDPH (new daily persistent headache) 10/16/2020   Hypertriglyceridemia 07/31/2020   Low bone density 09/20/2019   Osteopenia 07/21/2019   PCOS (polycystic ovarian syndrome) 04/17/2019   Hypervitaminosis D 04/17/2019     THERAPY DIAG:  Acute pain of right shoulder  Goal of screen:  This patient was referred to Physical Therapy specialty screen by Elspeth Parker, MD for HEP.   [employer] HEP code:  Access Code: [email or web address] URL:  [email or web address] Date: 11/24/2023 Prepared by: Harlene Cordon  Exercises - Seated Scapular Retraction  - Standing Shoulder Flexion to 90 Degrees  - 1 x daily - 7 x weekly - 1 sets - 10 reps   Clinical Impression & Plan:  Pt with right shoulder irritation following a forward fall when her dog bumped into her. Winging of Rt scapula and guarding creating anteriorly placed humoral head. Used wall for tactile cue of proper scap retraction. Encouraged ice regularly and to message me with any further questions.    Harlene Cordon PT, DPT 11/24/2023, 11:21 AM  [address] [place], [place] [address] [phone]   Note: charges not applied for screen.

## 2023-11-24 NOTE — Progress Notes (Addendum)
 Chief Complaint: Shoulder pain     History of Present Illness:    Shelly Myers is a 58 y.o. female right dominant female presents with right shoulder pain after she had a trip and fall when her puppy and knocked her down 2 weeks prior.  2 days prior she was going to grasp laundry and felt a pop with pain in this.  This is her dominant arm.  Denies any history of surgical intervention on the shoulder.  She does have good range of motion although pain particular with external range of motion    PMH/PSH/Family History/Social History/Meds/Allergies:    Past Medical History:  Diagnosis Date  . Allergy   . Arthritis   . Cancer (HCC)    skin cancer  . DDD (degenerative disc disease), cervical   . Family history of adverse reaction to anesthesia    mom had reaction to versed - lost eye control  . GERD (gastroesophageal reflux disease)   . Hypertension   . Osteopenia   . PCOS (polycystic ovarian syndrome)    Past Surgical History:  Procedure Laterality Date  . BREAST ENHANCEMENT SURGERY    . DILATATION & CURETTAGE/HYSTEROSCOPY WITH MYOSURE N/A 08/13/2021   Procedure: DILATATION & CURETTAGE/HYSTEROSCOPY WITH MYOSURE;  Surgeon: Storm Setter, DO;  Location: [facility];  Service: Gynecology;  Laterality: N/A;  . HAND SURGERY Bilateral   . TONSILLECTOMY    . TRANSFORAMINAL LUMBAR INTERBODY FUSION W/ MIS 2 LEVEL N/A 10/06/2022   Procedure: Lumbar Four-Lumbar Five, Lumbar Five-Sacral One Minimally Invasive Transforaminal Lumbar Interbody Fusion;  Surgeon: Cheryle Debby LABOR, MD;  Location: [facility];  Service: Neurosurgery;  Laterality: N/A;  RM 20 to follow   Social History   Socioeconomic History  . Marital status: Married    Spouse name: Not on file  . Number of children: 1  . Years of education: Not on file  . Highest education level: Not on file  Occupational History  . Not on file  Tobacco Use  . Smoking status: Never  . Smokeless tobacco: Never  Vaping  Use  . Vaping status: Never Used  Substance and Sexual Activity  . Alcohol use: Not Currently    Comment: once or twice a year around a holiday  . Drug use: Never  . Sexual activity: Not on file  Other Topics Concern  . Not on file  Social History Narrative   Lives at home with husband   Right handed   Caffeine: 3 cups/day   Social Drivers of Corporate investment banker Strain: Not on file  Food Insecurity: Not on file  Transportation Needs: Not on file  Physical Activity: Not on file  Stress: Not on file  Social Connections: Not on file   Family History  Problem Relation Age of Onset  . Hypertension Mother   . Osteopenia Mother   . Diabetes Father   . Skin cancer Father   . Prostate cancer Father   . Esophageal cancer Father   . Hypertension Father   . Hyperlipidemia Father   . Headache Neg Hx   . Migraines Neg Hx    No Known Allergies Current Outpatient Medications  Medication Sig Dispense Refill  . AIMOVIG 140 MG/ML SOAJ Inject into the skin every 30 (thirty) days.    . alendronate  (FOSAMAX ) 70 MG tablet Take 1 tablet (70 mg total) by mouth every 7 (seven) days. Take with a full glass of water on an empty stomach. 12 tablet 3  .  ALPRAZolam  (XANAX ) 1 MG tablet Take 3 mg by mouth at bedtime.    . atorvastatin  (LIPITOR) 10 MG tablet Take 1 tablet (10 mg total) by mouth daily. 90 tablet 3  . clindamycin  (CLEOCIN  T) 1 % external solution 3 times/day as needed-between meals & bedtime.     . cyclobenzaprine  (FLEXERIL ) 10 MG tablet Take 10 mg by mouth 3 (three) times daily.    . cyclobenzaprine  (FLEXERIL ) 5 MG tablet Take 1 tablet (5 mg total) by mouth 3 (three) times daily as needed for muscle spasms. (Patient not taking: Reported on 08/02/2023) 30 tablet 0  . EPINEPHrine  0.3 mg/0.3 mL IJ SOAJ injection Inject 0.3 mg into the muscle as needed for anaphylaxis.    . fexofenadine-pseudoephedrine (ALLEGRA-D) 60-120 MG 12 hr tablet Take 1 tablet by mouth 2 (two) times daily.    SABRA  guaiFENesin-codeine 100-10 MG/5ML syrup Take 5 mLs by mouth every 6 (six) hours as needed.    . metFORMIN  (GLUCOPHAGE -XR) 500 MG 24 hr tablet Take 3 tablets (1,500 mg total) by mouth daily. 270 tablet 3  . metoprolol  succinate (TOPROL -XL) 25 MG 24 hr tablet Take 25 mg by mouth daily.    . Multiple Vitamins-Minerals (MULTIVITAMIN WITH MINERALS) tablet Take 1 tablet by mouth daily.    . oxyCODONE  10 MG TABS Take 1 tablet (10 mg total) by mouth every 4 (four) hours as needed for severe pain ((score 7 to 10)). 30 tablet 0  . oxyCODONE -acetaminophen  (PERCOCET) 10-325 MG tablet Take 1 tablet by mouth 5 (five) times daily as needed.    SABRA PREMPRO 0.45-1.5 MG tablet Take 1 tablet by mouth daily.    . rizatriptan  (MAXALT -MLT) 10 MG disintegrating tablet Take 10 mg by mouth daily as needed for migraine.    . traZODone  (DESYREL ) 100 MG tablet Take 400 mg by mouth at bedtime.    . triamcinolone  cream (KENALOG ) 0.1 % Apply 1 Application topically 2 (two) times daily as needed (Skin cancer on chest).    . TRINTELLIX  20 MG TABS tablet Take 20 mg by mouth daily.    . YUVAFEM 10 MCG TABS vaginal tablet Place 1 tablet vaginally 2 (two) times a week.     No current facility-administered medications for this visit.   No results found.  Review of Systems:   A ROS was performed including pertinent positives and negatives as documented in the HPI.  Physical Exam :   Constitutional: NAD and appears stated age Neurological: Alert and oriented Psych: Appropriate affect and cooperative There were no vitals taken for this visit.   Comprehensive Musculoskeletal Exam:    Right shoulder with tenderness about the lateral deltoid.  Active forward elevation is to 170 degrees equal bilaterally.  External rotation is to 50 degrees bilaterally.  Internal rotation is L1 bilaterally.  Absent of Neer impingement   Imaging:   Xray (3 views right shoulder): Mild superior migration of the humeral head    I personally  reviewed and interpreted the radiographs.   Assessment and Plan:   58 y.o. female with evidence of right shoulder rotator cuff contusion versus rotator cuff tearing after a fall on the side.  At this initial visit I did recommend an ultrasound guided injection of the subacromial space.  Will plan to begin with this and a home exercise program.  I will plan to see her back in 4 weeks for reassessment  -Return to clinic 4 weeks for reassessment    Procedure Note  Patient: Asianna Shoults  Date of Birth: Nov 06, 1965           MRN: [id number]             Visit Date: 11/24/2023  Procedures: Visit Diagnoses:  1. Acute pain of right shoulder     Large Joint Inj: R subacromial bursa on 11/24/2023 12:28 PM Indications: pain Details: 22 G 1.5 in needle, ultrasound-guided anterior approach  Arthrogram: No  Medications: 4 mL lidocaine  1 %; 80 mg triamcinolone  acetonide 40 MG/ML Outcome: tolerated well, no immediate complications Procedure, treatment alternatives, risks and benefits explained, specific risks discussed. Consent was given by the patient. Immediately prior to procedure a time out was called to verify the correct patient, procedure, equipment, support staff and site/side marked as required. Patient was prepped and draped in the usual sterile fashion.       I personally saw and evaluated the patient, and participated in the management and treatment plan.  Elspeth Parker, MD Attending Physician, Orthopedic Surgery  This document was dictated using Dragon voice recognition software. A reasonable attempt at proof reading has been made to minimize errors.

## 2023-11-29 ENCOUNTER — Telehealth (HOSPITAL_BASED_OUTPATIENT_CLINIC_OR_DEPARTMENT_OTHER): Payer: Self-pay | Admitting: Orthopaedic Surgery

## 2023-11-29 NOTE — Telephone Encounter (Signed)
 Patient would like to come in to see Dr B because she thinks she has torn her tricep. Please advise where I should put her on sch.

## 2023-11-30 ENCOUNTER — Encounter (HOSPITAL_BASED_OUTPATIENT_CLINIC_OR_DEPARTMENT_OTHER): Payer: Self-pay | Admitting: Student

## 2023-11-30 ENCOUNTER — Ambulatory Visit (HOSPITAL_BASED_OUTPATIENT_CLINIC_OR_DEPARTMENT_OTHER): Admitting: Student

## 2023-11-30 DIAGNOSIS — M25511 Pain in right shoulder: Secondary | ICD-10-CM | POA: Diagnosis not present

## 2023-11-30 NOTE — Progress Notes (Signed)
 Chief Complaint: Right arm pain     History of Present Illness:   11/30/23: Patient presents today for follow-up evaluation of pain in her right upper arm.  She was seen last week in clinic and received a subacromial cortisone injection as well as some home exercises for scapular winging.  Later that day, she reports that she was playing with her 150 pound Shelly Myers and shortly after noted a deformity of her right bicep.  Does not recall feeling a large pop or any significant pain.  Overall she reports that discomfort is mild.   11/24/23: Shelly Myers is a 58 y.o. female right dominant female presents with right shoulder pain after she had a trip and fall when her puppy and knocked her down 2 weeks prior.  2 days prior she was going to grasp laundry and felt a pop with pain in this.  This is her dominant arm.  Denies any history of surgical intervention on the shoulder.  She does have good range of motion although pain particular with external range of motion  PMH/PSH/Family History/Social History/Meds/Allergies:    Past Medical History:  Diagnosis Date   Allergy    Arthritis    Cancer (HCC)    skin cancer   DDD (degenerative disc disease), cervical    Family history of adverse reaction to anesthesia    mom had reaction to versed - lost eye control   GERD (gastroesophageal reflux disease)    Hypertension    Osteopenia    PCOS (polycystic ovarian syndrome)    Past Surgical History:  Procedure Laterality Date   BREAST ENHANCEMENT SURGERY     DILATATION & CURETTAGE/HYSTEROSCOPY WITH MYOSURE N/A 08/13/2021   Procedure: DILATATION & CURETTAGE/HYSTEROSCOPY WITH MYOSURE;  Surgeon: Storm Setter, DO;  Location: [facility];  Service: Gynecology;  Laterality: N/A;   HAND SURGERY Bilateral    TONSILLECTOMY     TRANSFORAMINAL LUMBAR INTERBODY FUSION W/ MIS 2 LEVEL N/A 10/06/2022   Procedure: Lumbar Four-Lumbar Five, Lumbar Five-Sacral One Minimally Invasive  Transforaminal Lumbar Interbody Fusion;  Surgeon: Cheryle Debby LABOR, MD;  Location: [facility];  Service: Neurosurgery;  Laterality: N/A;  RM 20 to follow   Social History   Socioeconomic History   Marital status: Married    Spouse name: Not on file   Number of children: 1   Years of education: Not on file   Highest education level: Not on file  Occupational History   Not on file  Tobacco Use   Smoking status: Never   Smokeless tobacco: Never  Vaping Use   Vaping status: Never Used  Substance and Sexual Activity   Alcohol use: Not Currently    Comment: once or twice a year around a holiday   Drug use: Never   Sexual activity: Not on file  Other Topics Concern   Not on file  Social History Narrative   Lives at home with husband   Right handed   Caffeine: 3 cups/day   Social Drivers of Corporate investment banker Strain: Not on file  Food Insecurity: Not on file  Transportation Needs: Not on file  Physical Activity: Not on file  Stress: Not on file  Social Connections: Not on file   Family History  Problem Relation Age of Onset   Hypertension Mother    Osteopenia Mother    Diabetes Father    Skin cancer Father    Prostate cancer Father    Esophageal cancer Father  Hypertension Father    Hyperlipidemia Father    Headache Neg Hx    Migraines Neg Hx    No Known Allergies Current Outpatient Medications  Medication Sig Dispense Refill   AIMOVIG 140 MG/ML SOAJ Inject into the skin every 30 (thirty) days.     alendronate  (FOSAMAX ) 70 MG tablet Take 1 tablet (70 mg total) by mouth every 7 (seven) days. Take with a full glass of water on an empty stomach. 12 tablet 3   ALPRAZolam  (XANAX ) 1 MG tablet Take 3 mg by mouth at bedtime.     atorvastatin  (LIPITOR) 10 MG tablet Take 1 tablet (10 mg total) by mouth daily. 90 tablet 3   clindamycin  (CLEOCIN  T) 1 % external solution 3 times/day as needed-between meals & bedtime.      cyclobenzaprine  (FLEXERIL ) 10 MG tablet Take 10  mg by mouth 3 (three) times daily.     cyclobenzaprine  (FLEXERIL ) 5 MG tablet Take 1 tablet (5 mg total) by mouth 3 (three) times daily as needed for muscle spasms. (Patient not taking: Reported on 08/02/2023) 30 tablet 0   EPINEPHrine  0.3 mg/0.3 mL IJ SOAJ injection Inject 0.3 mg into the muscle as needed for anaphylaxis.     fexofenadine-pseudoephedrine (ALLEGRA-D) 60-120 MG 12 hr tablet Take 1 tablet by mouth 2 (two) times daily.     guaiFENesin-codeine 100-10 MG/5ML syrup Take 5 mLs by mouth every 6 (six) hours as needed.     metFORMIN  (GLUCOPHAGE -XR) 500 MG 24 hr tablet Take 3 tablets (1,500 mg total) by mouth daily. 270 tablet 3   metoprolol  succinate (TOPROL -XL) 25 MG 24 hr tablet Take 25 mg by mouth daily.     Multiple Vitamins-Minerals (MULTIVITAMIN WITH MINERALS) tablet Take 1 tablet by mouth daily.     oxyCODONE  10 MG TABS Take 1 tablet (10 mg total) by mouth every 4 (four) hours as needed for severe pain ((score 7 to 10)). 30 tablet 0   oxyCODONE -acetaminophen  (PERCOCET) 10-325 MG tablet Take 1 tablet by mouth 5 (five) times daily as needed.     PREMPRO 0.45-1.5 MG tablet Take 1 tablet by mouth daily.     rizatriptan  (MAXALT -MLT) 10 MG disintegrating tablet Take 10 mg by mouth daily as needed for migraine.     traZODone  (DESYREL ) 100 MG tablet Take 400 mg by mouth at bedtime.     triamcinolone  cream (KENALOG ) 0.1 % Apply 1 Application topically 2 (two) times daily as needed (Skin cancer on chest).     TRINTELLIX  20 MG TABS tablet Take 20 mg by mouth daily.     YUVAFEM 10 MCG TABS vaginal tablet Place 1 tablet vaginally 2 (two) times a week.     No current facility-administered medications for this visit.   No results found.  Review of Systems:   A ROS was performed including pertinent positives and negatives as documented in the HPI.  Physical Exam :   Constitutional: NAD and appears stated age Neurological: Alert and oriented Psych: Appropriate affect and cooperative There were  no vitals taken for this visit.   Comprehensive Musculoskeletal Exam:    Right shoulder exam demonstrates visible presence of a Popeye deformity.  Strength with elbow flexion and forearm supination is well-maintained.  Negative hook test at the distal biceps tendon.  Full range of motion with elbow flexion and extension.  Imaging:     Assessment and Plan:   58 y.o. female presenting with an acute Popeye deformity over the right bicep.  Does not recall a  significant injury but noticed this after playing with her large dog.  Overall her pain is well-controlled without use of medication and strength with elbow flexion and supination is well-maintained.  Based on her exam, I do have concern for a long head biceps rupture as the distal biceps appears intact.  Given this I have recommended proceeding with an MRI of the right shoulder for further evaluation and we will plan to have her follow-up with Dr. Genelle for review and treatment discussion.  She can continue to use the shoulder as tolerated but advised against any heavy lifting.     I personally saw and evaluated the patient, and participated in the management and treatment plan.  Leonce Reveal, PA-C Orthopedics  This document was dictated using Conservation officer, historic buildings. A reasonable attempt at proof reading has been made to minimize errors.

## 2023-12-07 ENCOUNTER — Ambulatory Visit
Admission: RE | Admit: 2023-12-07 | Discharge: 2023-12-07 | Disposition: A | Discharge status: Home / Self Care | Source: Ambulatory Visit | Attending: Student

## 2023-12-07 DIAGNOSIS — M67813 Other specified disorders of tendon, right shoulder: Secondary | ICD-10-CM | POA: Diagnosis not present

## 2023-12-07 DIAGNOSIS — S46011A Strain of muscle(s) and tendon(s) of the rotator cuff of right shoulder, initial encounter: Secondary | ICD-10-CM | POA: Diagnosis not present

## 2023-12-07 DIAGNOSIS — M25511 Pain in right shoulder: Secondary | ICD-10-CM

## 2023-12-10 ENCOUNTER — Ambulatory Visit (HOSPITAL_BASED_OUTPATIENT_CLINIC_OR_DEPARTMENT_OTHER): Payer: Self-pay | Admitting: Orthopaedic Surgery

## 2023-12-10 ENCOUNTER — Other Ambulatory Visit (HOSPITAL_BASED_OUTPATIENT_CLINIC_OR_DEPARTMENT_OTHER): Payer: Self-pay

## 2023-12-10 ENCOUNTER — Ambulatory Visit (HOSPITAL_BASED_OUTPATIENT_CLINIC_OR_DEPARTMENT_OTHER): Admitting: Orthopaedic Surgery

## 2023-12-10 DIAGNOSIS — M25511 Pain in right shoulder: Secondary | ICD-10-CM | POA: Diagnosis not present

## 2023-12-10 MED ORDER — IBUPROFEN 800 MG PO TABS
800.0000 mg | ORAL_TABLET | Freq: Three times a day (TID) | ORAL | 0 refills | Status: AC
  Filled 2023-12-10: qty 30, 10d supply, fill #0

## 2023-12-10 MED ORDER — OXYCODONE HCL 5 MG PO TABS
5.0000 mg | ORAL_TABLET | ORAL | 0 refills | Status: DC | PRN
  Filled 2023-12-10: qty 10, 4d supply, fill #0

## 2023-12-10 MED ORDER — ASPIRIN 325 MG PO TBEC
325.0000 mg | DELAYED_RELEASE_TABLET | Freq: Every day | ORAL | 0 refills | Status: DC
  Filled 2023-12-10: qty 14, 14d supply, fill #0

## 2023-12-10 MED ORDER — ACETAMINOPHEN 500 MG PO TABS
500.0000 mg | ORAL_TABLET | Freq: Three times a day (TID) | ORAL | 0 refills | Status: AC
  Filled 2023-12-10: qty 30, 10d supply, fill #0

## 2023-12-10 NOTE — Progress Notes (Signed)
 Expand All Collapse All       Chief Complaint: Right arm pain        History of Present Illness:    12/10/2023: Presents today for follow-up of the right shoulder  11/30/23: Patient presents today for follow-up evaluation of pain in her right upper arm.  She was seen last week in clinic and received a subacromial cortisone injection as well as some home exercises for scapular winging.  Later that day, she reports that she was playing with her 150 pound [name] and shortly after noted a deformity of her right bicep.  Does not recall feeling a large pop or any significant pain.  Overall she reports that discomfort is mild.     11/24/23: Shelly Myers is a 58 y.o. female right dominant female presents with right shoulder pain after she had a trip and fall when her puppy and knocked her down 2 weeks prior.  2 days prior she was going to grasp laundry and felt a pop with pain in this.  This is her dominant arm.  Denies any history of surgical intervention on the shoulder.  She does have good range of motion although pain particular with external range of motion   PMH/PSH/Family History/Social History/Meds/Allergies:         Past Medical History:  Diagnosis Date   Allergy     Arthritis     Cancer (HCC)      skin cancer   DDD (degenerative disc disease), cervical     Family history of adverse reaction to anesthesia      mom had reaction to versed - lost eye control   GERD (gastroesophageal reflux disease)     Hypertension     Osteopenia     PCOS (polycystic ovarian syndrome)               Past Surgical History:  Procedure Laterality Date   BREAST ENHANCEMENT SURGERY       DILATATION & CURETTAGE/HYSTEROSCOPY WITH MYOSURE N/A 08/13/2021    Procedure: DILATATION & CURETTAGE/HYSTEROSCOPY WITH MYOSURE;  Surgeon: Storm Setter, DO;  Location: [facility];  Service: Gynecology;  Laterality: N/A;   HAND SURGERY Bilateral     TONSILLECTOMY       TRANSFORAMINAL  LUMBAR INTERBODY FUSION W/ MIS 2 LEVEL N/A 10/06/2022    Procedure: Lumbar Four-Lumbar Five, Lumbar Five-Sacral One Minimally Invasive Transforaminal Lumbar Interbody Fusion;  Surgeon: Cheryle Debby LABOR, MD;  Location: [facility];  Service: Neurosurgery;  Laterality: N/A;  RM 20 to follow        Social History         Socioeconomic History   Marital status: Married      Spouse name: Not on file   Number of children: 1   Years of education: Not on file   Highest education level: Not on file  Occupational History   Not on file  Tobacco Use   Smoking status: Never   Smokeless tobacco: Never  Vaping Use   Vaping status: Never Used  Substance and Sexual Activity   Alcohol use: Not Currently      Comment: once or twice a year around a holiday   Drug use: Never   Sexual activity: Not on file  Other Topics Concern   Not on file  Social History Narrative    Lives at home with husband    Right handed    Caffeine: 3 cups/day    Social Drivers of Manufacturing engineer  Strain: Not on file  Food Insecurity: Not on file  Transportation Needs: Not on file  Physical Activity: Not on file  Stress: Not on file  Social Connections: Not on file         Family History  Problem Relation Age of Onset   Hypertension Mother     Osteopenia Mother     Diabetes Father     Skin cancer Father     Prostate cancer Father     Esophageal cancer Father     Hypertension Father     Hyperlipidemia Father     Headache Neg Hx     Migraines Neg Hx          Allergies  No Known Allergies         Current Outpatient Medications  Medication Sig Dispense Refill   AIMOVIG 140 MG/ML SOAJ Inject into the skin every 30 (thirty) days.       alendronate  (FOSAMAX ) 70 MG tablet Take 1 tablet (70 mg total) by mouth every 7 (seven) days. Take with a full glass of water on an empty stomach. 12 tablet 3   ALPRAZolam  (XANAX ) 1 MG tablet Take 3 mg by mouth at bedtime.       atorvastatin  (LIPITOR) 10 MG tablet  Take 1 tablet (10 mg total) by mouth daily. 90 tablet 3   clindamycin  (CLEOCIN  T) 1 % external solution 3 times/day as needed-between meals & bedtime.        cyclobenzaprine  (FLEXERIL ) 10 MG tablet Take 10 mg by mouth 3 (three) times daily.       cyclobenzaprine  (FLEXERIL ) 5 MG tablet Take 1 tablet (5 mg total) by mouth 3 (three) times daily as needed for muscle spasms. (Patient not taking: Reported on 08/02/2023) 30 tablet 0   EPINEPHrine  0.3 mg/0.3 mL IJ SOAJ injection Inject 0.3 mg into the muscle as needed for anaphylaxis.       fexofenadine-pseudoephedrine (ALLEGRA-D) 60-120 MG 12 hr tablet Take 1 tablet by mouth 2 (two) times daily.       guaiFENesin-codeine 100-10 MG/5ML syrup Take 5 mLs by mouth every 6 (six) hours as needed.       metFORMIN  (GLUCOPHAGE -XR) 500 MG 24 hr tablet Take 3 tablets (1,500 mg total) by mouth daily. 270 tablet 3   metoprolol  succinate (TOPROL -XL) 25 MG 24 hr tablet Take 25 mg by mouth daily.       Multiple Vitamins-Minerals (MULTIVITAMIN WITH MINERALS) tablet Take 1 tablet by mouth daily.       oxyCODONE  10 MG TABS Take 1 tablet (10 mg total) by mouth every 4 (four) hours as needed for severe pain ((score 7 to 10)). 30 tablet 0   oxyCODONE -acetaminophen  (PERCOCET) 10-325 MG tablet Take 1 tablet by mouth 5 (five) times daily as needed.       PREMPRO 0.45-1.5 MG tablet Take 1 tablet by mouth daily.       rizatriptan  (MAXALT -MLT) 10 MG disintegrating tablet Take 10 mg by mouth daily as needed for migraine.       traZODone  (DESYREL ) 100 MG tablet Take 400 mg by mouth at bedtime.       triamcinolone  cream (KENALOG ) 0.1 % Apply 1 Application topically 2 (two) times daily as needed (Skin cancer on chest).       TRINTELLIX  20 MG TABS tablet Take 20 mg by mouth daily.       YUVAFEM 10 MCG TABS vaginal tablet Place 1 tablet vaginally 2 (two) times a week.  No current facility-administered medications for this visit.      Imaging Results (Last 48 hours)  No results  found.     Review of Systems:   A ROS was performed including pertinent positives and negatives as documented in the HPI.   Physical Exam :   Constitutional: NAD and appears stated age Neurological: Alert and oriented Psych: Appropriate affect and cooperative There were no vitals taken for this visit.    Comprehensive Musculoskeletal Exam:     Right shoulder exam demonstrates visible presence of a Popeye deformity.  Strength with elbow flexion and forearm supination is well-maintained.  Negative hook test at the distal biceps tendon.  Full range of motion with elbow flexion and extension.   Imaging:     MRI right shoulder: Full-thickness supraspinatus tear without retraction, full-thickness biceps tendon tear   Assessment and Plan:   58 y.o. female presenting with an acute Popeye deformity over the right bicep.  Does not recall a significant injury but noticed this after playing with her large dog.  Overall her pain is well-controlled without use of medication and strength with elbow flexion and supination is well-maintained.  MRI shows full-thickness supraspinatus tear as well as biceps tendon tear.  This time I did discuss with her the risks and benefits and options.  She has tried an injection without any relief.  At this time she is somewhat disappointed in her biceps deformity.  I did discuss the natural progression of rotator cuff tearing.  After discussion she would ultimately like to proceed for right shoulder arthroscopy with rotator cuff repair and subpectoral biceps tenodesis  - Plan for right shoulder arthroscopy with rotator cuff repair and subpectoral biceps tenodesis   After a lengthy discussion of treatment options, including risks, benefits, alternatives, complications of surgical and nonsurgical conservative options, the patient elected surgical repair.   The patient  is aware of the material risks  and complications including, but not limited to injury to adjacent  structures, neurovascular injury, infection, numbness, bleeding, implant failure, thermal burns, stiffness, persistent pain, failure to heal, disease transmission from allograft, need for further surgery, dislocation, anesthetic risks, blood clots, risks of death,and others. The probabilities of surgical success and failure discussed with patient given their particular co-morbidities.The time and nature of expected rehabilitation and recovery was discussed.The patient's questions were all answered preoperatively.  No barriers to understanding were noted. I explained the natural history of the disease process and Rx rationale.  I explained to the patient what I considered to be reasonable expectations given their personal situation.  The final treatment plan was arrived at through a shared patient decision making process model.          I personally saw and evaluated the patient, and participated in the management and treatment plan.

## 2023-12-15 DIAGNOSIS — M47812 Spondylosis without myelopathy or radiculopathy, cervical region: Secondary | ICD-10-CM | POA: Diagnosis not present

## 2023-12-15 DIAGNOSIS — M62830 Muscle spasm of back: Secondary | ICD-10-CM | POA: Diagnosis not present

## 2023-12-15 DIAGNOSIS — G43009 Migraine without aura, not intractable, without status migrainosus: Secondary | ICD-10-CM | POA: Diagnosis not present

## 2023-12-15 DIAGNOSIS — G894 Chronic pain syndrome: Secondary | ICD-10-CM | POA: Diagnosis not present

## 2023-12-20 DIAGNOSIS — F3342 Major depressive disorder, recurrent, in full remission: Secondary | ICD-10-CM | POA: Diagnosis not present

## 2023-12-22 DIAGNOSIS — M47812 Spondylosis without myelopathy or radiculopathy, cervical region: Secondary | ICD-10-CM | POA: Diagnosis not present

## 2023-12-22 DIAGNOSIS — M47816 Spondylosis without myelopathy or radiculopathy, lumbar region: Secondary | ICD-10-CM | POA: Diagnosis not present

## 2024-01-03 DIAGNOSIS — R928 Other abnormal and inconclusive findings on diagnostic imaging of breast: Secondary | ICD-10-CM | POA: Diagnosis not present

## 2024-01-03 DIAGNOSIS — R92333 Mammographic heterogeneous density, bilateral breasts: Secondary | ICD-10-CM | POA: Diagnosis not present

## 2024-01-11 DIAGNOSIS — E673 Hypervitaminosis D: Secondary | ICD-10-CM | POA: Diagnosis not present

## 2024-01-11 DIAGNOSIS — Z01818 Encounter for other preprocedural examination: Secondary | ICD-10-CM | POA: Diagnosis not present

## 2024-01-11 DIAGNOSIS — Z13220 Encounter for screening for lipoid disorders: Secondary | ICD-10-CM | POA: Diagnosis not present

## 2024-01-13 DIAGNOSIS — M47812 Spondylosis without myelopathy or radiculopathy, cervical region: Secondary | ICD-10-CM | POA: Diagnosis not present

## 2024-01-13 DIAGNOSIS — M62830 Muscle spasm of back: Secondary | ICD-10-CM | POA: Diagnosis not present

## 2024-01-13 DIAGNOSIS — G894 Chronic pain syndrome: Secondary | ICD-10-CM | POA: Diagnosis not present

## 2024-01-13 DIAGNOSIS — G43009 Migraine without aura, not intractable, without status migrainosus: Secondary | ICD-10-CM | POA: Diagnosis not present

## 2024-01-22 DIAGNOSIS — M47812 Spondylosis without myelopathy or radiculopathy, cervical region: Secondary | ICD-10-CM | POA: Diagnosis not present

## 2024-01-22 DIAGNOSIS — M47816 Spondylosis without myelopathy or radiculopathy, lumbar region: Secondary | ICD-10-CM | POA: Diagnosis not present

## 2024-02-10 DIAGNOSIS — M47812 Spondylosis without myelopathy or radiculopathy, cervical region: Secondary | ICD-10-CM | POA: Diagnosis not present

## 2024-02-10 DIAGNOSIS — G43009 Migraine without aura, not intractable, without status migrainosus: Secondary | ICD-10-CM | POA: Diagnosis not present

## 2024-02-10 DIAGNOSIS — M62830 Muscle spasm of back: Secondary | ICD-10-CM | POA: Diagnosis not present

## 2024-02-10 DIAGNOSIS — G894 Chronic pain syndrome: Secondary | ICD-10-CM | POA: Diagnosis not present

## 2024-02-21 ENCOUNTER — Encounter (HOSPITAL_BASED_OUTPATIENT_CLINIC_OR_DEPARTMENT_OTHER): Payer: Self-pay | Admitting: Orthopaedic Surgery

## 2024-02-22 DIAGNOSIS — M47816 Spondylosis without myelopathy or radiculopathy, lumbar region: Secondary | ICD-10-CM | POA: Diagnosis not present

## 2024-02-23 NOTE — Progress Notes (Signed)
      Enhanced Recovery after Surgery  Enhanced Recovery after Surgery is a protocol used to improve the stress on your body and your recovery after surgery.  Patient Instructions  The night before surgery:  No food after midnight. ONLY clear liquids after midnight  The day of surgery (if you do NOT have diabetes):  Drink ONE (1) Pre-Surgery Clear Ensure as directed.   This drink was given to you during your hospital  pre-op appointment visit. The pre-op nurse will instruct you on the time to drink the  Pre-Surgery Ensure depending on your surgery time. Finish the drink at the designated time by the pre-op nurse.  Nothing else to drink after completing the  Pre-Surgery Clear Ensure.  The day of surgery (if you have diabetes): Drink ONE (1) Gatorade 2 (G2) as directed. This drink was given to you during your hospital  pre-op appointment visit.  The pre-op nurse will instruct you on the time to drink the   Gatorade 2 (G2) depending on your surgery time. Color of the Gatorade may vary. Red is not allowed. Nothing else to drink after completing the  Gatorade 2 (G2).         If you have questions, please contact your surgeon's office.  Pre-surgical soap and instructions given as well.

## 2024-02-25 NOTE — Anesthesia Preprocedure Evaluation (Addendum)
 Anesthesia Evaluation  Patient identified by MRN, date of birth, ID band Patient awake    Reviewed: Allergy & Precautions, H&P , NPO status , Patient's Chart, lab work & pertinent test results, reviewed documented beta blocker date and time   Airway Mallampati: II  TM Distance: >3 FB Neck ROM: Full    Dental  (+) Teeth Intact, Dental Advisory Given   Pulmonary neg pulmonary ROS   Pulmonary exam normal breath sounds clear to auscultation       Cardiovascular hypertension (137/72 preop), Pt. on medications and Pt. on home beta blockers Normal cardiovascular exam Rhythm:Regular Rate:Normal     Neuro/Psych  Headaches  negative psych ROS   GI/Hepatic Neg liver ROS,GERD  Controlled,,  Endo/Other    Renal/GU negative Renal ROS  negative genitourinary   Musculoskeletal  (+) Arthritis , Osteoarthritis,    Abdominal   Peds negative pediatric ROS (+)  Hematology negative hematology ROS (+)   Anesthesia Other Findings   Reproductive/Obstetrics PCOS- metformin                               Anesthesia Physical Anesthesia Plan  ASA: 2  Anesthesia Plan: General and Regional   Post-op Pain Management: Regional block*, Tylenol  PO (pre-op)* and Toradol  IV (intra-op)*   Induction: Intravenous  PONV Risk Score and Plan: 3 and Ondansetron , Dexamethasone , Midazolam  and Treatment may vary due to age or medical condition  Airway Management Planned: Oral ETT  Additional Equipment: None  Intra-op Plan:   Post-operative Plan: Extubation in OR  Informed Consent: I have reviewed the patients History and Physical, chart, labs and discussed the procedure including the risks, benefits and alternatives for the proposed anesthesia with the patient or authorized representative who has indicated his/her understanding and acceptance.     Dental advisory given  Plan Discussed with: CRNA  Anesthesia Plan  Comments:          Anesthesia Quick Evaluation

## 2024-02-28 ENCOUNTER — Encounter (HOSPITAL_BASED_OUTPATIENT_CLINIC_OR_DEPARTMENT_OTHER): Payer: Self-pay | Admitting: Orthopaedic Surgery

## 2024-02-28 ENCOUNTER — Ambulatory Visit (HOSPITAL_BASED_OUTPATIENT_CLINIC_OR_DEPARTMENT_OTHER)
Admission: RE | Admit: 2024-02-28 | Discharge: 2024-02-28 | Disposition: A | Discharge status: Home / Self Care | Attending: Orthopaedic Surgery | Admitting: Orthopaedic Surgery

## 2024-02-28 ENCOUNTER — Other Ambulatory Visit: Payer: Self-pay

## 2024-02-28 ENCOUNTER — Ambulatory Visit (HOSPITAL_BASED_OUTPATIENT_CLINIC_OR_DEPARTMENT_OTHER): Payer: Self-pay | Admitting: Anesthesiology

## 2024-02-28 ENCOUNTER — Encounter (HOSPITAL_BASED_OUTPATIENT_CLINIC_OR_DEPARTMENT_OTHER)
Admission: RE | Disposition: A | Discharge status: Home / Self Care | Payer: Self-pay | Source: Home / Self Care | Attending: Orthopaedic Surgery

## 2024-02-28 DIAGNOSIS — S46011A Strain of muscle(s) and tendon(s) of the rotator cuff of right shoulder, initial encounter: Secondary | ICD-10-CM | POA: Diagnosis not present

## 2024-02-28 DIAGNOSIS — M65919 Unspecified synovitis and tenosynovitis, unspecified shoulder: Secondary | ICD-10-CM | POA: Diagnosis not present

## 2024-02-28 DIAGNOSIS — Z01818 Encounter for other preprocedural examination: Secondary | ICD-10-CM

## 2024-02-28 DIAGNOSIS — G8918 Other acute postprocedural pain: Secondary | ICD-10-CM | POA: Diagnosis not present

## 2024-02-28 DIAGNOSIS — W010XXA Fall on same level from slipping, tripping and stumbling without subsequent striking against object, initial encounter: Secondary | ICD-10-CM | POA: Insufficient documentation

## 2024-02-28 DIAGNOSIS — R519 Headache, unspecified: Secondary | ICD-10-CM | POA: Diagnosis not present

## 2024-02-28 DIAGNOSIS — M25511 Pain in right shoulder: Secondary | ICD-10-CM

## 2024-02-28 DIAGNOSIS — I10 Essential (primary) hypertension: Secondary | ICD-10-CM | POA: Insufficient documentation

## 2024-02-28 DIAGNOSIS — M8580 Other specified disorders of bone density and structure, unspecified site: Secondary | ICD-10-CM | POA: Diagnosis not present

## 2024-02-28 DIAGNOSIS — Z7983 Long term (current) use of bisphosphonates: Secondary | ICD-10-CM | POA: Insufficient documentation

## 2024-02-28 DIAGNOSIS — M1990 Unspecified osteoarthritis, unspecified site: Secondary | ICD-10-CM | POA: Diagnosis not present

## 2024-02-28 DIAGNOSIS — K219 Gastro-esophageal reflux disease without esophagitis: Secondary | ICD-10-CM | POA: Insufficient documentation

## 2024-02-28 DIAGNOSIS — S46211A Strain of muscle, fascia and tendon of other parts of biceps, right arm, initial encounter: Secondary | ICD-10-CM

## 2024-02-28 DIAGNOSIS — Z79899 Other long term (current) drug therapy: Secondary | ICD-10-CM | POA: Insufficient documentation

## 2024-02-28 DIAGNOSIS — S46111A Strain of muscle, fascia and tendon of long head of biceps, right arm, initial encounter: Secondary | ICD-10-CM | POA: Insufficient documentation

## 2024-02-28 DIAGNOSIS — Z7984 Long term (current) use of oral hypoglycemic drugs: Secondary | ICD-10-CM | POA: Diagnosis not present

## 2024-02-28 DIAGNOSIS — M75101 Unspecified rotator cuff tear or rupture of right shoulder, not specified as traumatic: Secondary | ICD-10-CM | POA: Diagnosis not present

## 2024-02-28 HISTORY — PX: BICEPT TENODESIS: SHX5116

## 2024-02-28 HISTORY — PX: SHOULDER ARTHROSCOPY WITH ROTATOR CUFF REPAIR: SHX5685

## 2024-02-28 SURGERY — ARTHROSCOPY, SHOULDER, WITH ROTATOR CUFF REPAIR
Anesthesia: Regional | Site: Shoulder | Laterality: Right

## 2024-02-28 MED ORDER — ACETAMINOPHEN 500 MG PO TABS
ORAL_TABLET | ORAL | Status: AC
  Filled 2024-02-28: qty 2

## 2024-02-28 MED ORDER — SUGAMMADEX SODIUM 200 MG/2ML IV SOLN
INTRAVENOUS | Status: DC | PRN
  Administered 2024-02-28: 100 mg via INTRAVENOUS

## 2024-02-28 MED ORDER — MIDAZOLAM HCL 2 MG/2ML IJ SOLN
2.0000 mg | Freq: Once | INTRAMUSCULAR | Status: AC
Start: 2024-02-28 — End: 2024-02-28
  Administered 2024-02-28: 2 mg via INTRAVENOUS

## 2024-02-28 MED ORDER — GABAPENTIN 300 MG PO CAPS
ORAL_CAPSULE | ORAL | Status: AC
  Filled 2024-02-28: qty 1

## 2024-02-28 MED ORDER — OXYCODONE HCL 5 MG/5ML PO SOLN: 5.0000 mg | Freq: Once | ORAL | Status: DC | PRN

## 2024-02-28 MED ORDER — ONDANSETRON HCL 4 MG/2ML IJ SOLN
INTRAMUSCULAR | Status: AC
  Filled 2024-02-28: qty 2

## 2024-02-28 MED ORDER — DEXAMETHASONE SODIUM PHOSPHATE 10 MG/ML IJ SOLN
INTRAMUSCULAR | Status: AC
  Filled 2024-02-28: qty 1

## 2024-02-28 MED ORDER — IBUPROFEN 800 MG PO TABS
800.0000 mg | ORAL_TABLET | Freq: Three times a day (TID) | ORAL | 0 refills | Status: AC
Start: 2024-02-28 — End: 2024-03-09

## 2024-02-28 MED ORDER — LACTATED RINGERS IV SOLN
INTRAVENOUS | Status: DC
Start: 2024-02-28 — End: 2024-02-28

## 2024-02-28 MED ORDER — MIDAZOLAM HCL 2 MG/2ML IJ SOLN
INTRAMUSCULAR | Status: AC
  Filled 2024-02-28: qty 2

## 2024-02-28 MED ORDER — FENTANYL CITRATE (PF) 100 MCG/2ML IJ SOLN
100.0000 ug | Freq: Once | INTRAMUSCULAR | Status: AC
  Administered 2024-02-28: 100 ug via INTRAVENOUS

## 2024-02-28 MED ORDER — HYDROMORPHONE HCL 2 MG PO TABS: 2.0000 mg | ORAL_TABLET | Freq: Two times a day (BID) | ORAL | 0 refills | Status: AC | PRN

## 2024-02-28 MED ORDER — PHENYLEPHRINE 80 MCG/ML (10ML) SYRINGE FOR IV PUSH (FOR BLOOD PRESSURE SUPPORT)
PREFILLED_SYRINGE | INTRAVENOUS | Status: AC
  Filled 2024-02-28: qty 10

## 2024-02-28 MED ORDER — ROCURONIUM BROMIDE 100 MG/10ML IV SOLN
INTRAVENOUS | Status: DC | PRN
  Administered 2024-02-28: 60 mg via INTRAVENOUS

## 2024-02-28 MED ORDER — GABAPENTIN 300 MG PO CAPS: 300.0000 mg | ORAL_CAPSULE | Freq: Once | ORAL | Status: DC

## 2024-02-28 MED ORDER — PROPOFOL 10 MG/ML IV BOLUS
INTRAVENOUS | Status: DC | PRN
  Administered 2024-02-28: 100 mg via INTRAVENOUS

## 2024-02-28 MED ORDER — DEXMEDETOMIDINE HCL IN NACL 80 MCG/20ML IV SOLN
INTRAVENOUS | Status: AC
  Filled 2024-02-28: qty 20

## 2024-02-28 MED ORDER — LIDOCAINE 2% (20 MG/ML) 5 ML SYRINGE
INTRAMUSCULAR | Status: AC
  Filled 2024-02-28: qty 5

## 2024-02-28 MED ORDER — ROCURONIUM BROMIDE 10 MG/ML (PF) SYRINGE
PREFILLED_SYRINGE | INTRAVENOUS | Status: AC
  Filled 2024-02-28: qty 10

## 2024-02-28 MED ORDER — TRANEXAMIC ACID-NACL 1000-0.7 MG/100ML-% IV SOLN
INTRAVENOUS | Status: AC
  Filled 2024-02-28: qty 100

## 2024-02-28 MED ORDER — FENTANYL CITRATE (PF) 100 MCG/2ML IJ SOLN
INTRAMUSCULAR | Status: DC | PRN
  Administered 2024-02-28: 50 ug via INTRAVENOUS

## 2024-02-28 MED ORDER — ONDANSETRON HCL 4 MG/2ML IJ SOLN: 4.0000 mg | Freq: Once | INTRAMUSCULAR | Status: DC | PRN

## 2024-02-28 MED ORDER — ASPIRIN 325 MG PO TBEC: 325.0000 mg | DELAYED_RELEASE_TABLET | Freq: Every day | ORAL | 0 refills | Status: AC

## 2024-02-28 MED ORDER — KETOROLAC TROMETHAMINE 30 MG/ML IJ SOLN: 15.0000 mg | Freq: Once | INTRAMUSCULAR | Status: DC | PRN

## 2024-02-28 MED ORDER — CEFAZOLIN SODIUM-DEXTROSE 2-4 GM/100ML-% IV SOLN
2.0000 g | INTRAVENOUS | Status: AC
  Administered 2024-02-28: 2 g via INTRAVENOUS

## 2024-02-28 MED ORDER — TRANEXAMIC ACID-NACL 1000-0.7 MG/100ML-% IV SOLN
1000.0000 mg | INTRAVENOUS | Status: AC
  Administered 2024-02-28: 1000 mg via INTRAVENOUS

## 2024-02-28 MED ORDER — BUPIVACAINE LIPOSOME 1.3 % IJ SUSP
INTRAMUSCULAR | Status: DC | PRN
  Administered 2024-02-28: 10 mL via PERINEURAL

## 2024-02-28 MED ORDER — SODIUM CHLORIDE 0.9 % IR SOLN
Status: DC | PRN
  Administered 2024-02-28: 9000 mL

## 2024-02-28 MED ORDER — ROPIVACAINE HCL 5 MG/ML IJ SOLN
INTRAMUSCULAR | Status: DC | PRN
  Administered 2024-02-28: 12 mL via PERINEURAL

## 2024-02-28 MED ORDER — ACETAMINOPHEN 500 MG PO TABS: 500.0000 mg | ORAL_TABLET | Freq: Three times a day (TID) | ORAL | 0 refills | Status: AC

## 2024-02-28 MED ORDER — DEXAMETHASONE SODIUM PHOSPHATE 10 MG/ML IJ SOLN
INTRAMUSCULAR | Status: DC | PRN
  Administered 2024-02-28: 10 mg via INTRAVENOUS

## 2024-02-28 MED ORDER — FENTANYL CITRATE (PF) 100 MCG/2ML IJ SOLN
INTRAMUSCULAR | Status: AC
  Filled 2024-02-28: qty 2

## 2024-02-28 MED ORDER — MEPERIDINE HCL 25 MG/ML IJ SOLN: 6.2500 mg | INTRAMUSCULAR | Status: DC | PRN

## 2024-02-28 MED ORDER — CEFAZOLIN SODIUM-DEXTROSE 2-4 GM/100ML-% IV SOLN
INTRAVENOUS | Status: AC
  Filled 2024-02-28: qty 100

## 2024-02-28 MED ORDER — PHENYLEPHRINE HCL (PRESSORS) 10 MG/ML IV SOLN
INTRAVENOUS | Status: DC | PRN
  Administered 2024-02-28 (×3): 80 ug via INTRAVENOUS
  Administered 2024-02-28: 120 ug via INTRAVENOUS
  Administered 2024-02-28 (×3): 80 ug via INTRAVENOUS
  Administered 2024-02-28: 40 ug via INTRAVENOUS
  Administered 2024-02-28: 80 ug via INTRAVENOUS

## 2024-02-28 MED ORDER — HYDROMORPHONE HCL 1 MG/ML IJ SOLN: 0.2500 mg | INTRAMUSCULAR | Status: DC | PRN

## 2024-02-28 MED ORDER — AMISULPRIDE (ANTIEMETIC) 5 MG/2ML IV SOLN: 10.0000 mg | Freq: Once | INTRAVENOUS | Status: DC | PRN

## 2024-02-28 MED ORDER — ONDANSETRON HCL 4 MG/2ML IJ SOLN
INTRAMUSCULAR | Status: DC | PRN
  Administered 2024-02-28: 4 mg via INTRAVENOUS

## 2024-02-28 MED ORDER — ACETAMINOPHEN 500 MG PO TABS
1000.0000 mg | ORAL_TABLET | Freq: Once | ORAL | Status: AC
  Administered 2024-02-28: 1000 mg via ORAL

## 2024-02-28 MED ORDER — OXYCODONE HCL 5 MG PO TABS: 5.0000 mg | ORAL_TABLET | Freq: Once | ORAL | Status: DC | PRN

## 2024-02-28 MED ORDER — PROPOFOL 10 MG/ML IV BOLUS
INTRAVENOUS | Status: AC
  Filled 2024-02-28: qty 20

## 2024-02-28 MED ORDER — PROPOFOL 500 MG/50ML IV EMUL
INTRAVENOUS | Status: AC
  Filled 2024-02-28: qty 50

## 2024-02-28 MED ORDER — LIDOCAINE 2% (20 MG/ML) 5 ML SYRINGE
INTRAMUSCULAR | Status: DC | PRN
  Administered 2024-02-28: 40 mg via INTRAVENOUS

## 2024-02-28 SURGICAL SUPPLY — 68 items
ANCHOR JUGGERKNOT SOFT 2.9 (Anchor) IMPLANT
ANCHOR QUATTRO LINK KNTLS 2.9 (Anchor) IMPLANT
ANCHOR SUT BLK/BLU W/4-PRO BB (Insert) IMPLANT
ANCHOR SUT QUATTRO KNTLS 4.5 (Anchor) IMPLANT
BENZOIN TINCTURE PRP APPL 2/3 (GAUZE/BANDAGES/DRESSINGS) IMPLANT
BLADE EXCALIBUR 4.0X13 (MISCELLANEOUS) ×2 IMPLANT
BLADE SURG 15 STRL LF DISP TIS (BLADE) IMPLANT
BURR OVAL 8 FLU 4.0X13 (MISCELLANEOUS) ×2 IMPLANT
CANN ARTH HYDRODAM HIP 8.5X75 (CANNULA) IMPLANT
CANNULA 5.75X71 LONG (CANNULA) IMPLANT
CANNULA PASSPORT 5 (CANNULA) IMPLANT
CANNULA PASSPORT BUTTON 10-40 (CANNULA) IMPLANT
CANNULA TWIST IN 8.25X7CM (CANNULA) IMPLANT
CHLORAPREP W/TINT 26 (MISCELLANEOUS) ×4 IMPLANT
CLSR STERI-STRIP ANTIMIC 1/2X4 (GAUZE/BANDAGES/DRESSINGS) IMPLANT
COOLER ICEMAN CLASSIC (MISCELLANEOUS) ×2 IMPLANT
DRAPE IMP U-DRAPE 54X76 (DRAPES) ×2 IMPLANT
DRAPE INCISE IOBAN 66X45 STRL (DRAPES) ×2 IMPLANT
DRAPE SHOULDER BEACH CHAIR (DRAPES) ×2 IMPLANT
DRAPE U-SHAPE 47X51 STRL (DRAPES) ×4 IMPLANT
DW OUTFLOW CASSETTE/TUBE SET (MISCELLANEOUS) ×2 IMPLANT
ELECTRODE REM PT RTRN 9FT ADLT (ELECTROSURGICAL) ×2 IMPLANT
GAUZE PAD ABD 8X10 STRL (GAUZE/BANDAGES/DRESSINGS) ×2 IMPLANT
GAUZE SPONGE 4X4 12PLY STRL (GAUZE/BANDAGES/DRESSINGS) ×2 IMPLANT
GAUZE XEROFORM 1X8 LF (GAUZE/BANDAGES/DRESSINGS) ×2 IMPLANT
GLOVE BIO SURGEON STRL SZ 6 (GLOVE) ×4 IMPLANT
GLOVE BIO SURGEON STRL SZ7.5 (GLOVE) ×4 IMPLANT
GLOVE BIOGEL PI IND STRL 6.5 (GLOVE) ×2 IMPLANT
GLOVE BIOGEL PI IND STRL 8 (GLOVE) ×2 IMPLANT
GOWN STRL REUS W/ TWL LRG LVL3 (GOWN DISPOSABLE) ×4 IMPLANT
GOWN STRL REUS W/ TWL XL LVL3 (GOWN DISPOSABLE) ×2 IMPLANT
GOWN STRL REUS W/TWL XL LVL3 (GOWN DISPOSABLE) ×2 IMPLANT
KIT STABILIZATION SHOULDER (MISCELLANEOUS) ×2 IMPLANT
KIT STR SPEAR 1.8 FBRTK DISP (KITS) IMPLANT
LASSO 90 CVE QUICKPAS (DISPOSABLE) IMPLANT
LASSO CRESCENT QUICKPASS (SUTURE) IMPLANT
LOOP 2 FIBERLINK CLOSED (SUTURE) IMPLANT
MANIFOLD NEPTUNE II (INSTRUMENTS) ×2 IMPLANT
NDL HD SCORPION MEGA LOADER (NEEDLE) IMPLANT
NDL SUT PASSER RTC (NEEDLE) IMPLANT
NEEDLE SUT PASSER RTC (NEEDLE) ×2 IMPLANT
PACK ARTHROSCOPY DSU (CUSTOM PROCEDURE TRAY) ×2 IMPLANT
PACK BASIN DAY SURGERY FS (CUSTOM PROCEDURE TRAY) ×2 IMPLANT
PAD COLD SHLDR WRAP-ON (PAD) ×2 IMPLANT
PENCIL SMOKE EVACUATOR (MISCELLANEOUS) IMPLANT
RESTRAINT HEAD UNIVERSAL NS (MISCELLANEOUS) ×2 IMPLANT
SHEET MEDIUM DRAPE 40X70 STRL (DRAPES) ×2 IMPLANT
SLEEVE SCD COMPRESS KNEE MED (STOCKING) ×2 IMPLANT
SPONGE T-LAP 4X18 ~~LOC~~+RFID (SPONGE) ×2 IMPLANT
SR knotless shoulder implant kit ×2 IMPLANT
SUT BRDBAND LP MO-6 NDL BK/BL (SUTURE) IMPLANT
SUT ETHILON 3 0 PS 1 (SUTURE) ×2 IMPLANT
SUT MON AB 4-0 PS1 27 (SUTURE) IMPLANT
SUT PDS AB 1 CT 36 (SUTURE) IMPLANT
SUT TIGER TAPE 7 IN WHITE (SUTURE) IMPLANT
SUT VIC AB 0 CT1 27XBRD ANBCTR (SUTURE) IMPLANT
SUT VIC AB 0 CT2 27 (SUTURE) IMPLANT
SUT VIC AB 2-0 CT1 TAPERPNT 27 (SUTURE) IMPLANT
SUT VIC AB 2-0 SH 27XBRD (SUTURE) IMPLANT
SUTURE FIBERWR #2 38 T-5 BLUE (SUTURE) IMPLANT
SUTURE TAPE 1.3 40 TPR END (SUTURE) IMPLANT
SUTURE TAPE TIGERLINK 1.3MM BL (SUTURE) IMPLANT
TAPE FIBER 2MM 7IN #2 BLUE (SUTURE) IMPLANT
TOWEL GREEN STERILE FF (TOWEL DISPOSABLE) ×4 IMPLANT
TUBE CONNECTING 20X1/4 (TUBING) ×2 IMPLANT
TUBING ARTHROSCOPY IRRIG 16FT (MISCELLANEOUS) ×2 IMPLANT
WAND ABLATOR APOLLO I90 (BUR) ×2 IMPLANT
YANKAUER SUCT BULB TIP NO VENT (SUCTIONS) IMPLANT

## 2024-02-28 NOTE — H&P (Signed)
 Expand All Collapse All       Chief Complaint: Right arm pain        History of Present Illness:    12/10/2023: Presents today for follow-up of the right shoulder  11/30/23: Patient presents today for follow-up evaluation of pain in her right upper arm.  She was seen last week in clinic and received a subacromial cortisone injection as well as some home exercises for scapular winging.  Later that day, she reports that she was playing with her 150 pound Shelly Myers and shortly after noted a deformity of her right bicep.  Does not recall feeling a large pop or any significant pain.  Overall she reports that discomfort is mild.     11/24/23: Shelly Myers is a 58 y.o. female right dominant female presents with right shoulder pain after she had a trip and fall when her puppy and knocked her down 2 weeks prior.  2 days prior she was going to grasp laundry and felt a pop with pain in this.  This is her dominant arm.  Denies any history of surgical intervention on the shoulder.  She does have good range of motion although pain particular with external range of motion   PMH/PSH/Family History/Social History/Meds/Allergies:         Past Medical History:  Diagnosis Date   Allergy     Arthritis     Cancer (HCC)      skin cancer   DDD (degenerative disc disease), cervical     Family history of adverse reaction to anesthesia      mom had reaction to versed - lost eye control   GERD (gastroesophageal reflux disease)     Hypertension     Osteopenia     PCOS (polycystic ovarian syndrome)               Past Surgical History:  Procedure Laterality Date   BREAST ENHANCEMENT SURGERY       DILATATION & CURETTAGE/HYSTEROSCOPY WITH MYOSURE N/A 08/13/2021    Procedure: DILATATION & CURETTAGE/HYSTEROSCOPY WITH MYOSURE;  Surgeon: Storm Setter, DO;  Location: [facility];  Service: Gynecology;  Laterality: N/A;   HAND SURGERY Bilateral     TONSILLECTOMY       TRANSFORAMINAL  LUMBAR INTERBODY FUSION W/ MIS 2 LEVEL N/A 10/06/2022    Procedure: Lumbar Four-Lumbar Five, Lumbar Five-Sacral One Minimally Invasive Transforaminal Lumbar Interbody Fusion;  Surgeon: Cheryle Debby LABOR, MD;  Location: [facility] OR;  Service: Neurosurgery;  Laterality: N/A;  RM 20 to follow        Social History         Socioeconomic History   Marital status: Married      Spouse name: Not on file   Number of children: 1   Years of education: Not on file   Highest education level: Not on file  Occupational History   Not on file  Tobacco Use   Smoking status: Never   Smokeless tobacco: Never  Vaping Use   Vaping status: Never Used  Substance and Sexual Activity   Alcohol use: Not Currently      Comment: once or twice a year around a holiday   Drug use: Never   Sexual activity: Not on file  Other Topics Concern   Not on file  Social History Narrative    Lives at home with husband    Right handed    Caffeine: 3 cups/day    Social Drivers of Manufacturing engineer  Strain: Not on file  Food Insecurity: Not on file  Transportation Needs: Not on file  Physical Activity: Not on file  Stress: Not on file  Social Connections: Not on file         Family History  Problem Relation Age of Onset   Hypertension Mother     Osteopenia Mother     Diabetes Father     Skin cancer Father     Prostate cancer Father     Esophageal cancer Father     Hypertension Father     Hyperlipidemia Father     Headache Neg Hx     Migraines Neg Hx          Allergies  No Known Allergies         Current Outpatient Medications  Medication Sig Dispense Refill   AIMOVIG 140 MG/ML SOAJ Inject into the skin every 30 (thirty) days.       alendronate  (FOSAMAX ) 70 MG tablet Take 1 tablet (70 mg total) by mouth every 7 (seven) days. Take with a full glass of water on an empty stomach. 12 tablet 3   ALPRAZolam  (XANAX ) 1 MG tablet Take 3 mg by mouth at bedtime.       atorvastatin  (LIPITOR) 10 MG tablet  Take 1 tablet (10 mg total) by mouth daily. 90 tablet 3   clindamycin  (CLEOCIN  T) 1 % external solution 3 times/day as needed-between meals & bedtime.        cyclobenzaprine  (FLEXERIL ) 10 MG tablet Take 10 mg by mouth 3 (three) times daily.       cyclobenzaprine  (FLEXERIL ) 5 MG tablet Take 1 tablet (5 mg total) by mouth 3 (three) times daily as needed for muscle spasms. (Patient not taking: Reported on 08/02/2023) 30 tablet 0   EPINEPHrine  0.3 mg/0.3 mL IJ SOAJ injection Inject 0.3 mg into the muscle as needed for anaphylaxis.       fexofenadine-pseudoephedrine (ALLEGRA-D) 60-120 MG 12 hr tablet Take 1 tablet by mouth 2 (two) times daily.       guaiFENesin-codeine 100-10 MG/5ML syrup Take 5 mLs by mouth every 6 (six) hours as needed.       metFORMIN  (GLUCOPHAGE -XR) 500 MG 24 hr tablet Take 3 tablets (1,500 mg total) by mouth daily. 270 tablet 3   metoprolol  succinate (TOPROL -XL) 25 MG 24 hr tablet Take 25 mg by mouth daily.       Multiple Vitamins-Minerals (MULTIVITAMIN WITH MINERALS) tablet Take 1 tablet by mouth daily.       oxyCODONE  10 MG TABS Take 1 tablet (10 mg total) by mouth every 4 (four) hours as needed for severe pain ((score 7 to 10)). 30 tablet 0   oxyCODONE -acetaminophen  (PERCOCET) 10-325 MG tablet Take 1 tablet by mouth 5 (five) times daily as needed.       PREMPRO 0.45-1.5 MG tablet Take 1 tablet by mouth daily.       rizatriptan  (MAXALT -MLT) 10 MG disintegrating tablet Take 10 mg by mouth daily as needed for migraine.       traZODone  (DESYREL ) 100 MG tablet Take 400 mg by mouth at bedtime.       triamcinolone  cream (KENALOG ) 0.1 % Apply 1 Application topically 2 (two) times daily as needed (Skin cancer on chest).       TRINTELLIX  20 MG TABS tablet Take 20 mg by mouth daily.       YUVAFEM 10 MCG TABS vaginal tablet Place 1 tablet vaginally 2 (two) times a week.  No current facility-administered medications for this visit.      Imaging Results (Last 48 hours)  No results  found.     Review of Systems:   A ROS was performed including pertinent positives and negatives as documented in the HPI.   Physical Exam :   Constitutional: NAD and appears stated age Neurological: Alert and oriented Psych: Appropriate affect and cooperative There were no vitals taken for this visit.    Comprehensive Musculoskeletal Exam:     Right shoulder exam demonstrates visible presence of a Popeye deformity.  Strength with elbow flexion and forearm supination is well-maintained.  Negative hook test at the distal biceps tendon.  Full range of motion with elbow flexion and extension.   Imaging:     MRI right shoulder: Full-thickness supraspinatus tear without retraction, full-thickness biceps tendon tear   Assessment and Plan:   58 y.o. female presenting with an acute Popeye deformity over the right bicep.  Does not recall a significant injury but noticed this after playing with her large dog.  Overall her pain is well-controlled without use of medication and strength with elbow flexion and supination is well-maintained.  MRI shows full-thickness supraspinatus tear as well as biceps tendon tear.  This time I did discuss with her the risks and benefits and options.  She has tried an injection without any relief.  At this time she is somewhat disappointed in her biceps deformity.  I did discuss the natural progression of rotator cuff tearing.  After discussion she would ultimately like to proceed for right shoulder arthroscopy with rotator cuff repair and subpectoral biceps tenodesis  - Plan for right shoulder arthroscopy with rotator cuff repair and subpectoral biceps tenodesis   After a lengthy discussion of treatment options, including risks, benefits, alternatives, complications of surgical and nonsurgical conservative options, the patient elected surgical repair.   The patient  is aware of the material risks  and complications including, but not limited to injury to adjacent  structures, neurovascular injury, infection, numbness, bleeding, implant failure, thermal burns, stiffness, persistent pain, failure to heal, disease transmission from allograft, need for further surgery, dislocation, anesthetic risks, blood clots, risks of death,and others. The probabilities of surgical success and failure discussed with patient given their particular co-morbidities.The time and nature of expected rehabilitation and recovery was discussed.The patient's questions were all answered preoperatively.  No barriers to understanding were noted. I explained the natural history of the disease process and Rx rationale.  I explained to the patient what I considered to be reasonable expectations given their personal situation.  The final treatment plan was arrived at through a shared patient decision making process model.          I personally saw and evaluated the patient, and participated in the management and treatment plan.

## 2024-02-28 NOTE — Discharge Instructions (Addendum)
 Discharge Instructions    Attending Surgeon: Elspeth Parker, MD Office Phone Number: [phone]   Diagnosis and Procedures:    Surgeries Performed: Right shoulder rotator cuff repair with biceps tenodesis  Discharge Plan:    Diet: Resume usual diet. Begin with light or bland foods.  Drink plenty of fluids.  Activity:  Keep sling and dressing in place until your follow up visit in Physical Therapy You are advised to go home directly from the hospital or surgical center. Restrict your activities.  GENERAL INSTRUCTIONS: 1.  Keep your surgical site elevated above your heart for at least 5-7 days or longer to prevent swelling. This will improve your comfort and your overall recovery following surgery.     2. Please call Dr. Danetta office at [phone] with questions Monday-Friday during business hours. If no one answers, please leave a message and someone should get back to the patient within 24 hours. For emergencies please call 911 or proceed to the emergency room.   3. Patient to notify surgical team if experiences any of the following: Bowel/Bladder dysfunction, uncontrolled pain, nerve/muscle weakness, incision with increased drainage or redness, nausea/vomiting and Fever greater than 101.0 F.  Be alert for signs of infection including redness, streaking, odor, fever or chills. Be alert for excessive pain or bleeding and notify your surgeon immediately.  WOUND INSTRUCTIONS:   Leave your dressing/cast/splint in place until your post operative visit.  Keep it clean and dry.  Always keep the incision clean and dry until the staples/sutures are removed. If there is no drainage from the incision you should keep it open to air. If there is drainage from the incision you must keep it covered at all times until the drainage stops  Do not soak in a bath tub, hot tub, pool, lake or other body of water until 21 days after your surgery and your incision is completely dry and  healed.  If you have removable sutures (or staples) they must be removed 10-14 days (unless otherwise instructed) from the day of your surgery.     1)  Elevate the extremity as much as possible.  2)  Keep the dressing clean and dry.  3)  Please call us  if the dressing becomes wet or dirty.  4)  If you are experiencing worsening pain or worsening swelling, please call.     MEDICATIONS: Resume all previous home medications at the previous prescribed dose and frequency unless otherwise noted Start taking the  pain medications on an as-needed basis as prescribed  Please taper down pain medication over the next week following surgery.  Ideally you should not require a refill of any narcotic pain medication.  Take pain medication with food to minimize nausea. In addition to the prescribed pain medication, you may take over-the-counter pain relievers such as Tylenol .  Do NOT take additional tylenol  if your pain medication already has tylenol  in it.  Aspirin  325mg  daily per bottle instructions. Narcotic Policy: Per [facility] clinic policy, our goal is ensure optimal postoperative pain control with a multimodal pain management strategy. For all [facility] patients, our goal is to wean post-operative narcotic medications by 6 weeks post-operatively, and many times sooner. If this is not possible due to utilization of pain medication prior to surgery, your [facility] doctor will support your acute post-operative pain control for the first 6 weeks postoperatively, with a plan to transition you back to your primary pain team following that. Maralee will work to ensure a Therapist, occupational.  FOLLOWUP INSTRUCTIONS: 1. Follow up at the Physical Therapy Clinic 3-4 days following surgery. This appointment should be scheduled unless other arrangements have been made.The Physical Therapy scheduling number is [phone] if an appointment has not already been arranged.  2. Contact Dr. Danetta office during  office hours at [phone] or the practice after hours line at [phone] for non-emergencies. For medical emergencies call 911.   Discharge Location: Home    Post Anesthesia Home Care Instructions  Activity: Get plenty of rest for the remainder of the day. A responsible individual must stay with you for 24 hours following the procedure.  For the next 24 hours, DO NOT: -Drive a car -Advertising copywriter -Drink alcoholic beverages -Take any medication unless instructed by your physician -Make any legal decisions or sign important papers.  Meals: Start with liquid foods such as gelatin or soup. Progress to regular foods as tolerated. Avoid greasy, spicy, heavy foods. If nausea and/or vomiting occur, drink only clear liquids until the nausea and/or vomiting subsides. Call your physician if vomiting continues.  Special Instructions/Symptoms: Your throat may feel dry or sore from the anesthesia or the breathing tube placed in your throat during surgery. If this causes discomfort, gargle with warm salt water. The discomfort should disappear within 24 hours.  If you had a scopolamine  patch placed behind your ear for the management of post- operative nausea and/or vomiting:  1. The medication in the patch is effective for 72 hours, after which it should be removed.  Wrap patch in a tissue and discard in the trash. Wash hands thoroughly with soap and water. 2. You may remove the patch earlier than 72 hours if you experience unpleasant side effects which may include dry mouth, dizziness or visual disturbances. 3. Avoid touching the patch. Wash your hands with soap and water after contact with the patch.     Regional Anesthesia Blocks  1. You may not be able to move or feel the blocked extremity after a regional anesthetic block. This may last may last from 3-48 hours after placement, but it will go away. The length of time depends on the medication injected and your individual response to  the medication. As the nerves start to wake up, you may experience tingling as the movement and feeling returns to your extremity. If the numbness and inability to move your extremity has not gone away after 48 hours, please call your surgeon.   2. The extremity that is blocked will need to be protected until the numbness is gone and the strength has returned. Because you cannot feel it, you will need to take extra care to avoid injury. Because it may be weak, you may have difficulty moving it or using it. You may not know what position it is in without looking at it while the block is in effect.  3. For blocks in the legs and feet, returning to weight bearing and walking needs to be done carefully. You will need to wait until the numbness is entirely gone and the strength has returned. You should be able to move your leg and foot normally before you try and bear weight or walk. You will need someone to be with you when you first try to ensure you do not fall and possibly risk injury.  4. Bruising and tenderness at the needle site are common side effects and will resolve in a few days.  5. Persistent numbness or new problems with movement should be communicated to the surgeon or the  [facility] [phone] [facility] [phone]).  Information for Discharge Teaching: EXPAREL (bupivacaine liposome injectable suspension)   Pain relief is important to your recovery. The goal is to control your pain so you can move easier and return to your normal activities as soon as possible after your procedure. Your physician may use several types of medicines to manage pain, swelling, and more.  Your surgeon or anesthesiologist gave you EXPAREL(bupivacaine) to help control your pain after surgery.  EXPAREL is a local anesthetic designed to release slowly over an extended period of time to provide pain relief by numbing the tissue around the surgical site. EXPAREL is designed to  release pain medication over time and can control pain for up to 72 hours. Depending on how you respond to EXPAREL, you may require less pain medication during your recovery. EXPAREL can help reduce or eliminate the need for opioids during the first few days after surgery when pain relief is needed the most. EXPAREL is not an opioid and is not addictive. It does not cause sleepiness or sedation.   Important! A teal colored band has been placed on your arm with the date, time and amount of EXPAREL you have received. Please leave this armband in place for the full 96 hours following administration, and then you may remove the band. If you return to the hospital for any reason within 96 hours following the administration of EXPAREL, the armband provides important information that your health care providers to know, and alerts them that you have received this anesthetic.    Possible side effects of EXPAREL: Temporary loss of sensation or ability to move in the area where medication was injected. Nausea, vomiting, constipation Rarely, numbness and tingling in your mouth or lips, lightheadedness, or anxiety may occur. Call your doctor right away if you think you may be experiencing any of these sensations, or if you have other questions regarding possible side effects.  Follow all other discharge instructions given to you by your surgeon or nurse. Eat a healthy diet and drink plenty of water or other fluids.  No tylenol  until after 630pm

## 2024-02-28 NOTE — Anesthesia Procedure Notes (Signed)
 Procedure Name: Intubation Date/Time: 02/28/2024 1:13 PM  Performed by: Debarah Chiquita LABOR, CRNAPre-anesthesia Checklist: Patient identified, Emergency Drugs available, Suction available and Patient being monitored Patient Re-evaluated:Patient Re-evaluated prior to induction Oxygen Delivery Method: Circle system utilized Preoxygenation: Pre-oxygenation with 100% oxygen Induction Type: IV induction Ventilation: Mask ventilation without difficulty Laryngoscope Size: Mac and 3 Grade View: Grade I Tube type: Oral Tube size: 6.5 mm Number of attempts: 1 Airway Equipment and Method: Stylet and Oral airway Placement Confirmation: ETT inserted through vocal cords under direct vision, positive ETCO2 and breath sounds checked- equal and bilateral Secured at: 21 cm Tube secured with: Tape Dental Injury: Teeth and Oropharynx as per pre-operative assessment

## 2024-02-28 NOTE — Anesthesia Postprocedure Evaluation (Signed)
 Anesthesia Post Note  Patient: Daris Aristizabal  Procedure(s) Performed: ARTHROSCOPY, SHOULDER, WITH ROTATOR CUFF REPAIR (Right: Shoulder) TENODESIS, BICEPS (Right: Arm Upper)     Patient location during evaluation: Phase II Anesthesia Type: Regional and General Level of consciousness: awake and alert, oriented and patient cooperative Pain management: pain level controlled Vital Signs Assessment: post-procedure vital signs reviewed and stable Respiratory status: spontaneous breathing, nonlabored ventilation and respiratory function stable Cardiovascular status: blood pressure returned to baseline and stable Postop Assessment: no apparent nausea or vomiting Anesthetic complications: no   No notable events documented.  Last Vitals:  Vitals:   02/28/24 1500 02/28/24 1515  BP: 123/72 113/72  Pulse: 100 (!) 106  Resp: 17 18  Temp:  36.6 C  SpO2: 97% 95%    Last Pain:  Vitals:   02/28/24 1515  PainSc: 0-No pain                 Almarie HERO Siegfried Vieth

## 2024-02-28 NOTE — Anesthesia Procedure Notes (Signed)
 Anesthesia Regional Block: Interscalene brachial plexus block   Pre-Anesthetic Checklist: , timeout performed,  Correct Patient, Correct Site, Correct Laterality,  Correct Procedure, Correct Position, site marked,  Risks and benefits discussed,  Surgical consent,  Pre-op evaluation,  At surgeon's request and post-op pain management  Laterality: Right  Prep: Maximum Sterile Barrier Precautions used, chloraprep       Needles:  Injection technique: Single-shot  Needle Type: Echogenic Stimulator Needle     Needle Length: 9cm  Needle Gauge: 22     Additional Needles:   Procedures:,,,, ultrasound used (permanent image in chart),,    Narrative:  Start time: 02/28/2024 12:40 PM End time: 02/28/2024 12:45 PM Injection made incrementally with aspirations every 5 mL.  Performed by: Personally  Anesthesiologist: Merla Almarie HERO, DO  Additional Notes: Monitors applied. No increased pain on injection. No increased resistance to injection. Injection made in 5cc increments. Good needle visualization. Patient tolerated procedure well.

## 2024-02-28 NOTE — Op Note (Signed)
 Date of Surgery: 02/28/2024  INDICATIONS: Ms. Rottinghaus is a 58 y.o.-year-old female with right shoulder proximal biceps tear with anterior cable rotator cuff tear.  The risk and benefits of the procedure were discussed in detail and documented in the pre-operative evaluation.   PREOPERATIVE DIAGNOSES: Right shoulder, acute traumatic rotator cuff tear and biceps tear.  POSTOPERATIVE DIAGNOSIS: Same.  PROCEDURE: Open subpectoral biceps tenodesis - 76569 Arthroscopic limited debridement - 70177 Arthroscopic subacromial decompression - 70173 Arthroscopic rotator cuff repair - 70172  SURGEON: Elspeth LITTIE Parker MD  ASSISTANT: Conley Funk, ATC  ANESTHESIA:  general  IV FLUIDS AND URINE: See anesthesia record.  ANTIBIOTICS: Ancef   ESTIMATED BLOOD LOSS: 5 mL.  IMPLANTS:  Implant Name Type Inv. Item Serial No. Manufacturer Lot No. LRB No. Used Action  ANCHOR JUGGERKNOT SOFT 2.9 - [id number] Anchor ANCHOR JUGGERKNOT SOFT 2.9  [employer](ORTH,TRAU,BIO,SG) [id number] Right 1 Implanted  ANCHOR SUT QUATTRO KNTLS 4.5 - [id number] Anchor ANCHOR SUT QUATTRO KNTLS 4.5  [employer](ORTH,TRAU,BIO,SG) [id number] Right 1 Implanted  SR knotless shoulder implant kit     [id number] Right 1 Implanted    DRAINS: None  CULTURES: None  COMPLICATIONS: none  PROCEDURE:    OPERATIVE FINDING: Exam under anesthesia:   Examination under anesthesia revealed forward elevation of 150 degrees.  With the arm at the side, there was 65 degrees of external rotation.  There is a 1+ anterior load shift and a 1+ posterior load shift.    Arthroscopic findings demonstrated: Articular space: Rotator interval Redness/injection Chondral surfaces: Normal Biceps: Torn/frayed at insertion Subscapularis: Intact Supraspinatus: Full thickness anterior cable tear Infraspinatus: Intact    I identified the patient in the pre-operative holding area.  I marked the operative right shoulder with my initials. I reviewed the  risks and benefits of the proposed surgical intervention and the patient wished to proceed.  Anesthesia was then performed with regional block.  The patient was transferred to the operative suite and placed in the beach chair position with all bony prominences padded.     SCDs were placed on bilateral lower extremity. Appropriate antibiotics was administered within 1 hour before incision.  Anesthesia was induced.  The operative extremity was then prepped and draped in standard fashion. A time out was performed confirming the correct extremity, correct patient and correct procedure.   The arthroscope was introduced in the glenohumeral joint from a posterior portal.  An anterior portal was created.  The shoulder was examined and the above findings were noted.     With an arthroscopic shaver and a wand ablator, synovitis throughout the  shoulder was resected.  The arthroscopic shaver was used to excise torn portions of the labrum back to a stable margin. Specifically this was done for the anterior and superior labrum. The rotator interval was debrided using shaver and electrocautery.    Through visualization from intra-articular, the footprint of the rotator cuff was debrided of soft tissues back to bleeding bone.  This was done with an arthroscopic shaver.     The rotator cuff was then approached through the subacromial space. Anterior, lateral, and posterior portals were used.  Bursectomy was performed with an arthroscopic shaver and ArthroCare wand.  The soft tissues and 1 mm of bone on the undersurface of the acromion and clavicle were resected with the arthroscopic shaver and wand. There was good excursion that was noted of the tendon back to its footprint which would be amendable to an repair.   The rotator cuff was repaired with a  double row configuration with one medial row all suture suture anchor as noted above with sutures passed from anterior to posterior in horizontal fashion with a self  passing suture device. A total of 4 limbs of suture were passed.  The sutures were placed with a single lateral row anchor.  This provided excellent anatomic footprint approximation.  At this time the subpectoral biceps tenodesis was done.  15 blade was used to incise over the anterior border of the proximal biceps at the deltoid distal insertion.  Hemostasis was achieved with electrocautery.  The free torn end of the biceps was identified and tagged using a #2 tape.  This was then placed into a beta link anchor with excellent restoration of the biceps length.   The shoulder was irrigated.  The arthroscopic instruments were removed.  Wounds were closed with 3-0 nylon sutures.  A sterile dressing was applied with xeroform, 4x8s, abdominal pad, and tape. An Rosalita was placed and the upper extremity was placed in a shoulder immobilizer.  The patient tolerated the procedure well and was taken to the recovery room in stable condition.  All counts were correct in the case. The patient tolerated the procedure well and was taken to the recovery room in stable condition.    POSTOPERATIVE PLAN: She will follow the rotator cuff repair protocol.  She will place on aspirin  for blood clot prevention.  I will see her back in 2 weeks for suture removal  Elspeth LITTIE Parker, MD 2:18 PM

## 2024-02-28 NOTE — Brief Op Note (Signed)
   Brief Op Note  Date of Surgery: 02/28/2024  Preoperative Diagnosis: RIGHT SHOULDER ROTATOR CUFF TEAR  Postoperative Diagnosis: same  Procedure: Procedure(s): ARTHROSCOPY, SHOULDER, WITH ROTATOR CUFF REPAIR TENODESIS, BICEPS  Implants: Implant Name Type Inv. Item Serial No. Manufacturer Lot No. LRB No. Used Action  ANCHOR JUGGERKNOT SOFT 2.9 - [id number] Anchor ANCHOR JUGGERKNOT SOFT 2.9  [employer](ORTH,TRAU,BIO,SG) [id number] Right 1 Implanted  ANCHOR SUT QUATTRO KNTLS 4.5 - [id number] Anchor ANCHOR SUT QUATTRO KNTLS 4.5  [employer](ORTH,TRAU,BIO,SG) [id number] Right 1 Implanted  SR knotless shoulder implant kit     [id number] Right 1 Implanted    Surgeons: Surgeon(s): Genelle Standing, MD  Anesthesia: General    Estimated Blood Loss: See anesthesia record  Complications: None  Condition to PACU: Stable  Standing LITTIE Genelle, MD 02/28/2024 2:18 PM

## 2024-02-28 NOTE — Transfer of Care (Signed)
 Immediate Anesthesia Transfer of Care Note  Patient: Shelly Myers  Procedure(s) Performed: ARTHROSCOPY, SHOULDER, WITH ROTATOR CUFF REPAIR (Right: Shoulder) TENODESIS, BICEPS (Right: Arm Upper)  Patient Location: PACU  Anesthesia Type:General  Level of Consciousness: awake, alert , and patient cooperative  Airway & Oxygen Therapy: Patient Spontanous Breathing and Patient connected to nasal cannula oxygen  Post-op Assessment: Report given to RN and Post -op Vital signs reviewed and stable  Post vital signs: Reviewed and stable  Last Vitals:  Vitals Value Taken Time  BP 124/78 02/28/24 14:39  Temp    Pulse 103 02/28/24 14:41  Resp 18 02/28/24 14:41  SpO2 100 % 02/28/24 14:41  Vitals shown include unfiled device data.  Last Pain:  Vitals:   02/28/24 1244  PainSc: 0-No pain      Patients Stated Pain Goal: 4 (02/28/24 1213)  Complications: No notable events documented.

## 2024-02-28 NOTE — Progress Notes (Signed)
Assisted Dr. Finucane with right, interscalene , ultrasound guided block. Side rails up, monitors on throughout procedure. See vital signs in flow sheet. Tolerated Procedure well. ?

## 2024-02-29 ENCOUNTER — Telehealth: Payer: Self-pay | Admitting: Orthopaedic Surgery

## 2024-02-29 NOTE — Telephone Encounter (Signed)
 Pt called stating that they need pre authorization for medication Dilaudid . [facility]acy is [facility] [phone]. Pt would like call back when prescription is called in. Pt number is [phone].

## 2024-02-29 NOTE — Therapy (Addendum)
 OUTPATIENT PHYSICAL THERAPY SHOULDER EVALUATION   Patient Name: Shelly Myers MRN: [id number] DOB:03-Oct-1965, 58 y.o., female Today's Date: 03/01/2024  END OF SESSION:  PT End of Session - 03/01/24 1609     Visit Number 1    Number of Visits 19    Date for Recertification  05/24/24    Authorization Type auth required    PT Start Time 1502    PT Stop Time 1545    PT Time Calculation (min) 43 min    Activity Tolerance Patient limited by pain    Behavior During Therapy [facility] for tasks assessed/performed          Past Medical History:  Diagnosis Date   Allergy    Arthritis    Cancer (HCC)    skin cancer   DDD (degenerative disc disease), cervical    GERD (gastroesophageal reflux disease)    Hypertension    Osteopenia    PCOS (polycystic ovarian syndrome)    Past Surgical History:  Procedure Laterality Date   BREAST ENHANCEMENT SURGERY     DILATATION & CURETTAGE/HYSTEROSCOPY WITH MYOSURE N/A 08/13/2021   Procedure: DILATATION & CURETTAGE/HYSTEROSCOPY WITH MYOSURE;  Surgeon: Storm Setter, DO;  Location: [facility];  Service: Gynecology;  Laterality: N/A;   HAND SURGERY Bilateral    TONSILLECTOMY     TRANSFORAMINAL LUMBAR INTERBODY FUSION W/ MIS 2 LEVEL N/A 10/06/2022   Procedure: Lumbar Four-Lumbar Five, Lumbar Five-Sacral One Minimally Invasive Transforaminal Lumbar Interbody Fusion;  Surgeon: Cheryle Debby LABOR, MD;  Location: [facility] OR;  Service: Neurosurgery;  Laterality: N/A;  RM 20 to follow   Patient Active Problem List   Diagnosis Date Noted   Traumatic complete tear of right rotator cuff 02/28/2024   Tear of right biceps muscle 02/28/2024   Isthmic spondylolisthesis 10/06/2022   Dyslipidemia 07/30/2021   NDPH (new daily persistent headache) 10/16/2020   Hypertriglyceridemia 07/31/2020   Low bone density 09/20/2019   Osteopenia 07/21/2019   PCOS (polycystic ovarian syndrome) 04/17/2019   Hypervitaminosis D 04/17/2019    PCP: Therisa Holm,  PA  REFERRING PROVIDER: Elspeth Parker  REFERRING DIAG:  M25.511 (ICD-10-CM) - Acute pain of right shoulder    THERAPY DIAG:  Acute pain of right shoulder - Plan: PT plan of care cert/re-cert  Stiffness of right shoulder, not elsewhere classified - Plan: PT plan of care cert/re-cert  Rationale for Evaluation and Treatment: Rehabilitation  ONSET DATE: 02/28/24  SUBJECTIVE:                                                                                                                                                                                      SUBJECTIVE STATEMENT:  Patient reports she fell twice when playing with her 190 pound puppy. The first time the dog came up behind her and knocked her in the grass. This occurred in June 2025. The patient currently is unable to fill her prescription for pain management due to issues with insurance so she is only taking Tylenol  currently. Pain at it's best since surgery is 4/10. She reports she is using ice as needed and has been able to sleep with her bed elevated.  Hand dominance: Right  PERTINENT HISTORY: RTC and biceps tenodesis on 02/28/24 Hypertension   PAIN:  Are you having pain? Yes: NPRS scale: 7/10 Pain location: R shoulder Pain description: Achy with occasional sharpness Aggravating factors: N/A Relieving factors: N/A  PRECAUTIONS: Shoulder - See post-op protocol  RED FLAGS: None   WEIGHT BEARING RESTRICTIONS: Yes - See attached protocol  FALLS:  Has patient fallen in last 6 months? Yes. Number of falls 2  LIVING ENVIRONMENT: Lives with: lives with their spouse Lives in: House/apartment Stairs: Yes - internal & external stairs  Has following equipment at home: None  OCCUPATION: Not working  PLOF: Independent  PATIENT GOALS: Regain ROM and return to PLOF  NEXT MD VISIT: 03/10/24  OBJECTIVE:  Note: Objective measures were completed at Evaluation unless otherwise noted.  DIAGNOSTIC FINDINGS:     COGNITION: Overall cognitive status: Within functional limits for tasks assessed     SENSATION: Not tested  POSTURE: In a sling with R shoulder hiking   UPPER EXTREMITY ROM:   Active ROM Right eval Left eval  Shoulder flexion    Shoulder extension    Shoulder abduction    Shoulder adduction    Shoulder internal rotation    Shoulder external rotation    Elbow flexion    Elbow extension    Wrist flexion    Wrist extension    Wrist ulnar deviation    Wrist radial deviation    Wrist pronation    Wrist supination    (Blank rows = not tested)  UPPER EXTREMITY MMT:  MMT Right eval Left eval  Shoulder flexion    Shoulder extension    Shoulder abduction    Shoulder adduction    Shoulder internal rotation    Shoulder external rotation    Middle trapezius    Lower trapezius    Elbow flexion    Elbow extension    Wrist flexion    Wrist extension    Wrist ulnar deviation    Wrist radial deviation    Wrist pronation    Wrist supination    Grip strength (lbs)    (Blank rows = not tested)  JOINT MOBILITY TESTING:  Not tested due to surgery on 02/28/24  PALPATION:  Not tested due to surgery on 02/28/24                                                                                                                             TREATMENT DATE:  03/01/24 Patient education Changed bandage using 3 gauze and 3 tegaderm at the shoulder and 1 gauze and 1 tegaderm at the biceps incision  PATIENT EDUCATION: Education details: POC, post-op precautions Person educated: Patient and Spouse Education method: Explanation and Handouts Education comprehension: verbalized understanding  HOME EXERCISE PROGRAM: Access Code: [email or web address] URL: [email or web address] Date: 03/01/2024 Prepared by: Delon Pack  Exercises - Circular Shoulder Pendulum with Table Support  - 1 x daily - 7 x weekly - 1 sets - 10 reps - Flexion-Extension Shoulder Pendulum with Table Support   - 1 x daily - 7 x weekly - 1 sets - 10 reps - Horizontal Shoulder Pendulum with Table Support  - 1 x daily - 7 x weekly - 1 sets - 10 reps - Wrist Flexion and Extension With Shoulder Sling  - 1 x daily - 7 x weekly - 3 sets - 10 reps - Ball Squeeze With Shoulder Sling  - 1 x daily - 7 x weekly - 3 sets - 10 reps - Seated Wrist Radial Ulnar Deviation AROM  - 1 x daily - 7 x weekly - 3 sets - 10 reps - Seated Scapular Retraction  - 1 x daily - 7 x weekly - 3 sets - 10 reps  ASSESSMENT:  CLINICAL IMPRESSION: Patient is a 58 y.o. female who was seen today for physical therapy evaluation and treatment secondary to Rt RTC repair with biceps tenodesis on 02/28/24. PROM is unable to be assessed due to the patient's pain levels since she is having trouble with insurance covering her medication for pain management. Patient education is given regarding the POC, post-op precautions, and the HEP to begin when the pain is better managed. The patient will benefit from skilled physical therapy to improve shoulder ROM and strength.  OBJECTIVE IMPAIRMENTS: decreased mobility, decreased ROM, decreased strength, impaired UE functional use, postural dysfunction, and pain.   ACTIVITY LIMITATIONS: carrying, lifting, sleeping, dressing, reach over head, and hygiene/grooming  PARTICIPATION LIMITATIONS: cleaning, laundry, and driving  PERSONAL FACTORS: None  REHAB POTENTIAL: Excellent  CLINICAL DECISION MAKING: Stable/uncomplicated  EVALUATION COMPLEXITY: Low   GOALS: Goals reviewed with patient? Yes  SHORT TERM GOALS: Target date: 03/29/24  Patient will be compliant with HEP to decrease muscle waisting in the wrist Baseline: Goal status: INITIAL  2.  Patient will have >/= 150 degrees of shoulder flexion AAROM in order to be able to brush her hair independently Baseline:  Goal status: INITIAL   LONG TERM GOALS: Target date: 05/24/24  Patient will have full right shoulder AROM in all directions for  functional movements like washing her hair.  Baseline: Unable to assess due to surgery Goal status: INITIAL  2.  Patient will have >/= 4/5 strength in the Rt shoulder and elbow for functional tasks Baseline:  Goal status: INITIAL   PLAN:  PT FREQUENCY: 1-2x/week  PT DURATION: 12 weeks  PLANNED INTERVENTIONS: 97110-Therapeutic exercises, 97530- Therapeutic activity, 97112- Neuromuscular re-education, 97535- Self Care, 02859- Manual therapy, G0283- Electrical stimulation (unattended), Patient/Family education, Taping, Scar mobilization, DME instructions, and Cryotherapy  PLAN FOR NEXT SESSION: PROM & exercises within protocol limitations    Randall Pack, SPT 03/01/2024, 4:35 PM   I agree with the following treatment note after reviewing documentation. This session was performed under the supervision of a licensed clinician.  Saddie Raw, PT 03/01/24, 4:35 PM

## 2024-02-29 NOTE — Telephone Encounter (Signed)
 I checked covermymeds and her insurance can take 24hour to get a denial or approval back

## 2024-02-29 NOTE — Telephone Encounter (Signed)
 Prior auth completed with cover my meds

## 2024-02-29 NOTE — Telephone Encounter (Signed)
 Patient called and said that [facility] said that the prior auth didn't go through. CB#[phone]

## 2024-02-29 NOTE — Telephone Encounter (Signed)
 Called and notified patient.

## 2024-02-29 NOTE — Telephone Encounter (Signed)
 I called the pharmacy again at the request of the pt. They said pt just got oxy 10mg  150tabs that she takes 5 times a day on 9/13. By adding the dilaudid  she will be over her MME. Please advise

## 2024-02-29 NOTE — Telephone Encounter (Signed)
 I did advise pt

## 2024-03-01 ENCOUNTER — Other Ambulatory Visit: Payer: Self-pay

## 2024-03-01 ENCOUNTER — Other Ambulatory Visit (HOSPITAL_BASED_OUTPATIENT_CLINIC_OR_DEPARTMENT_OTHER): Payer: Self-pay | Admitting: Orthopaedic Surgery

## 2024-03-01 ENCOUNTER — Telehealth: Payer: Self-pay | Admitting: Orthopaedic Surgery

## 2024-03-01 ENCOUNTER — Encounter: Payer: Self-pay | Admitting: Rehabilitation

## 2024-03-01 ENCOUNTER — Ambulatory Visit: Attending: Orthopaedic Surgery | Admitting: Rehabilitation

## 2024-03-01 DIAGNOSIS — M25511 Pain in right shoulder: Secondary | ICD-10-CM | POA: Diagnosis not present

## 2024-03-01 DIAGNOSIS — M25611 Stiffness of right shoulder, not elsewhere classified: Secondary | ICD-10-CM | POA: Insufficient documentation

## 2024-03-01 MED ORDER — TRAMADOL HCL 50 MG PO TABS: 50.0000 mg | ORAL_TABLET | Freq: Four times a day (QID) | ORAL | 0 refills | Status: AC | PRN

## 2024-03-01 NOTE — Telephone Encounter (Signed)
 Patient's husband called. Says the pain doctor is on vacation. They are not able to get a RX from that doctor. Is there anything else that could be called in? His cb# [phone]

## 2024-03-01 NOTE — Telephone Encounter (Signed)
 Checked covermymeds. Still waiting on auth

## 2024-03-01 NOTE — Telephone Encounter (Signed)
 Update- I called the pharmacy and told them the pt needs this medication and I am still waiting on covermymeds to respond. I asked if she can just pay for it out of pocket. Due to her recently refilling the 150 tabs oxy from pain management, they are asking her pain management office to reach out to the pharmacist to give the ok to fill the dilaudid . I called and advised pt and she said she will reach out to her pain management MD to have them call to give the ok.

## 2024-03-02 ENCOUNTER — Encounter (HOSPITAL_BASED_OUTPATIENT_CLINIC_OR_DEPARTMENT_OTHER): Payer: Self-pay | Admitting: Orthopaedic Surgery

## 2024-03-07 ENCOUNTER — Encounter (HOSPITAL_BASED_OUTPATIENT_CLINIC_OR_DEPARTMENT_OTHER): Payer: Self-pay | Admitting: Orthopaedic Surgery

## 2024-03-10 ENCOUNTER — Ambulatory Visit (INDEPENDENT_AMBULATORY_CARE_PROVIDER_SITE_OTHER): Admitting: Orthopaedic Surgery

## 2024-03-10 ENCOUNTER — Encounter (HOSPITAL_BASED_OUTPATIENT_CLINIC_OR_DEPARTMENT_OTHER): Admitting: Orthopaedic Surgery

## 2024-03-10 DIAGNOSIS — M25511 Pain in right shoulder: Secondary | ICD-10-CM

## 2024-03-10 NOTE — Progress Notes (Signed)
 Chief Complaint: Right shoulder rotator cuff repair 9/29     History of Present Illness:    Shelly Myers is a 58 y.o. female right dominant female presents 2 weeks status post the above procedure.  Overall she is doing quite well.  She has begun physical therapy.  Denies any significant pain    PMH/PSH/Family History/Social History/Meds/Allergies:    Past Medical History:  Diagnosis Date   Allergy    Arthritis    Cancer (HCC)    skin cancer   DDD (degenerative disc disease), cervical    GERD (gastroesophageal reflux disease)    Hypertension    Osteopenia    PCOS (polycystic ovarian syndrome)    Past Surgical History:  Procedure Laterality Date   BICEPT TENODESIS Right 02/28/2024   Procedure: TENODESIS, BICEPS;  Surgeon: Genelle Standing, MD;  Location: [facility];  Service: Orthopedics;  Laterality: Right;   BREAST ENHANCEMENT SURGERY     DILATATION & CURETTAGE/HYSTEROSCOPY WITH MYOSURE N/A 08/13/2021   Procedure: DILATATION & CURETTAGE/HYSTEROSCOPY WITH MYOSURE;  Surgeon: Storm Setter, DO;  Location: [facility];  Service: Gynecology;  Laterality: N/A;   HAND SURGERY Bilateral    SHOULDER ARTHROSCOPY WITH ROTATOR CUFF REPAIR Right 02/28/2024   Procedure: ARTHROSCOPY, SHOULDER, WITH ROTATOR CUFF REPAIR;  Surgeon: Genelle Standing, MD;  Location: [facility];  Service: Orthopedics;  Laterality: Right;   TONSILLECTOMY     TRANSFORAMINAL LUMBAR INTERBODY FUSION W/ MIS 2 LEVEL N/A 10/06/2022   Procedure: Lumbar Four-Lumbar Five, Lumbar Five-Sacral One Minimally Invasive Transforaminal Lumbar Interbody Fusion;  Surgeon: Cheryle Debby LABOR, MD;  Location: [facility];  Service: Neurosurgery;  Laterality: N/A;  RM 20 to follow   Social History   Socioeconomic History   Marital status: Married    Spouse name: Not on file   Number of children: 1   Years of education: Not on file   Highest education level: Not on file  Occupational  History   Not on file  Tobacco Use   Smoking status: Never   Smokeless tobacco: Never  Vaping Use   Vaping status: Never Used  Substance and Sexual Activity   Alcohol use: Not Currently    Comment: once or twice a year around a holiday   Drug use: Never   Sexual activity: Not on file  Other Topics Concern   Not on file  Social History Narrative   Lives at home with husband   Right handed   Caffeine: 3 cups/day   Social Drivers of Corporate investment banker Strain: Not on file  Food Insecurity: Not on file  Transportation Needs: Not on file  Physical Activity: Not on file  Stress: Not on file  Social Connections: Not on file   Family History  Problem Relation Age of Onset   Hypertension Mother    Osteopenia Mother    Diabetes Father    Skin cancer Father    Prostate cancer Father    Esophageal cancer Father    Hypertension Father    Hyperlipidemia Father    Headache Neg Hx    Migraines Neg Hx    No Known Allergies Current Outpatient Medications  Medication Sig Dispense Refill   AIMOVIG 140 MG/ML SOAJ Inject into the skin every 30 (thirty) days.     ALPRAZolam  (XANAX ) 1 MG tablet Take 3 mg by mouth at bedtime.     aspirin  EC 325 MG tablet Take 1 tablet (325 mg total) by mouth daily. 30  tablet 0   clindamycin  (CLEOCIN  T) 1 % external solution 3 times/day as needed-between meals & bedtime.      cyclobenzaprine  (FLEXERIL ) 10 MG tablet Take 10 mg by mouth 3 (three) times daily.     EPINEPHrine  0.3 mg/0.3 mL IJ SOAJ injection Inject 0.3 mg into the muscle as needed for anaphylaxis.     metFORMIN  (GLUCOPHAGE -XR) 500 MG 24 hr tablet Take 3 tablets (1,500 mg total) by mouth daily. 270 tablet 3   metoprolol  succinate (TOPROL -XL) 25 MG 24 hr tablet Take 25 mg by mouth daily.     Multiple Vitamins-Minerals (MULTIVITAMIN WITH MINERALS) tablet Take 1 tablet by mouth daily.     oxyCODONE  10 MG TABS Take 1 tablet (10 mg total) by mouth every 4 (four) hours as needed for severe  pain ((score 7 to 10)). 30 tablet 0   PREMPRO 0.45-1.5 MG tablet Take 1 tablet by mouth daily.     rizatriptan  (MAXALT -MLT) 10 MG disintegrating tablet Take 10 mg by mouth daily as needed for migraine.     traMADol (ULTRAM) 50 MG tablet Take 1 tablet (50 mg total) by mouth every 6 (six) hours as needed. 30 tablet 0   traZODone  (DESYREL ) 100 MG tablet Take 400 mg by mouth at bedtime.     triamcinolone  cream (KENALOG ) 0.1 % Apply 1 Application topically 2 (two) times daily as needed (Skin cancer on chest).     TRINTELLIX  20 MG TABS tablet Take 20 mg by mouth daily.     YUVAFEM 10 MCG TABS vaginal tablet Place 1 tablet vaginally 2 (two) times a week.     No current facility-administered medications for this visit.   No results found.  Review of Systems:   A ROS was performed including pertinent positives and negatives as documented in the HPI.  Physical Exam :   Constitutional: NAD and appears stated age Neurological: Alert and oriented Psych: Appropriate affect and cooperative There were no vitals taken for this visit.   Comprehensive Musculoskeletal Exam:    Right shoulder incisions are well-appearing without erythema or drainage.  In the spine position she can forward elevate to 90 degrees with external rotation at the side to 30 degrees.  Distal neurosensory exam intact internal rotation deferred today   Imaging:    I personally reviewed and interpreted the radiographs.   Assessment and Plan:   58 y.o. female 2 weeks status post right shoulder rotator cuff repair doing well.  At this time should continue to follow the rotator cuff repair protocol plan to see her back in 4 weeks for reassessment  -Turn to clinic 4 weeks for reassessment   I personally saw and evaluated the patient, and participated in the management and treatment plan.  Elspeth Parker, MD Attending Physician, Orthopedic Surgery  This document was dictated using Dragon voice recognition software. A  reasonable attempt at proof reading has been made to minimize errors.

## 2024-03-23 DIAGNOSIS — M47816 Spondylosis without myelopathy or radiculopathy, lumbar region: Secondary | ICD-10-CM | POA: Diagnosis not present

## 2024-03-23 DIAGNOSIS — M47812 Spondylosis without myelopathy or radiculopathy, cervical region: Secondary | ICD-10-CM | POA: Diagnosis not present

## 2024-04-06 DIAGNOSIS — G43009 Migraine without aura, not intractable, without status migrainosus: Secondary | ICD-10-CM | POA: Diagnosis not present

## 2024-04-06 DIAGNOSIS — G894 Chronic pain syndrome: Secondary | ICD-10-CM | POA: Diagnosis not present

## 2024-04-06 DIAGNOSIS — M47812 Spondylosis without myelopathy or radiculopathy, cervical region: Secondary | ICD-10-CM | POA: Diagnosis not present

## 2024-04-06 DIAGNOSIS — M62830 Muscle spasm of back: Secondary | ICD-10-CM | POA: Diagnosis not present

## 2024-04-12 ENCOUNTER — Other Ambulatory Visit (HOSPITAL_BASED_OUTPATIENT_CLINIC_OR_DEPARTMENT_OTHER): Payer: Self-pay

## 2024-04-12 ENCOUNTER — Ambulatory Visit (INDEPENDENT_AMBULATORY_CARE_PROVIDER_SITE_OTHER): Admitting: Orthopaedic Surgery

## 2024-04-12 DIAGNOSIS — M25511 Pain in right shoulder: Secondary | ICD-10-CM

## 2024-04-12 MED ORDER — FLUZONE 0.5 ML IM SUSY
0.5000 mL | PREFILLED_SYRINGE | Freq: Once | INTRAMUSCULAR | 0 refills | Status: AC
  Filled 2024-04-12: qty 0.5, 1d supply, fill #0

## 2024-04-12 NOTE — Progress Notes (Signed)
 Chief Complaint: Right shoulder rotator cuff repair 9/29     History of Present Illness:    Shelly Myers is a 58 y.o. female right dominant female presents 6 weeks status post the above procedure.  Overall she is doing very well now with full active overhead range of motion.    PMH/PSH/Family History/Social History/Meds/Allergies:    Past Medical History:  Diagnosis Date   Allergy    Arthritis    Cancer (HCC)    skin cancer   DDD (degenerative disc disease), cervical    GERD (gastroesophageal reflux disease)    Hypertension    Osteopenia    PCOS (polycystic ovarian syndrome)    Past Surgical History:  Procedure Laterality Date   BICEPT TENODESIS Right 02/28/2024   Procedure: TENODESIS, BICEPS;  Surgeon: Genelle Standing, MD;  Location: [facility];  Service: Orthopedics;  Laterality: Right;   BREAST ENHANCEMENT SURGERY     DILATATION & CURETTAGE/HYSTEROSCOPY WITH MYOSURE N/A 08/13/2021   Procedure: DILATATION & CURETTAGE/HYSTEROSCOPY WITH MYOSURE;  Surgeon: Storm Setter, DO;  Location: [facility];  Service: Gynecology;  Laterality: N/A;   HAND SURGERY Bilateral    SHOULDER ARTHROSCOPY WITH ROTATOR CUFF REPAIR Right 02/28/2024   Procedure: ARTHROSCOPY, SHOULDER, WITH ROTATOR CUFF REPAIR;  Surgeon: Genelle Standing, MD;  Location: [facility];  Service: Orthopedics;  Laterality: Right;   TONSILLECTOMY     TRANSFORAMINAL LUMBAR INTERBODY FUSION W/ MIS 2 LEVEL N/A 10/06/2022   Procedure: Lumbar Four-Lumbar Five, Lumbar Five-Sacral One Minimally Invasive Transforaminal Lumbar Interbody Fusion;  Surgeon: Cheryle Debby LABOR, MD;  Location: [facility];  Service: Neurosurgery;  Laterality: N/A;  RM 20 to follow   Social History   Socioeconomic History   Marital status: Married    Spouse name: Not on file   Number of children: 1   Years of education: Not on file   Highest education level: Not on file  Occupational History   Not on  file  Tobacco Use   Smoking status: Never   Smokeless tobacco: Never  Vaping Use   Vaping status: Never Used  Substance and Sexual Activity   Alcohol use: Not Currently    Comment: once or twice a year around a holiday   Drug use: Never   Sexual activity: Not on file  Other Topics Concern   Not on file  Social History Narrative   Lives at home with husband   Right handed   Caffeine: 3 cups/day   Social Drivers of Corporate Investment Banker Strain: Not on file  Food Insecurity: Not on file  Transportation Needs: Not on file  Physical Activity: Not on file  Stress: Not on file  Social Connections: Not on file   Family History  Problem Relation Age of Onset   Hypertension Mother    Osteopenia Mother    Diabetes Father    Skin cancer Father    Prostate cancer Father    Esophageal cancer Father    Hypertension Father    Hyperlipidemia Father    Headache Neg Hx    Migraines Neg Hx    No Known Allergies Current Outpatient Medications  Medication Sig Dispense Refill   AIMOVIG 140 MG/ML SOAJ Inject into the skin every 30 (thirty) days.     ALPRAZolam  (XANAX ) 1 MG tablet Take 3 mg by mouth at bedtime.     aspirin  EC 325 MG tablet Take 1 tablet (325 mg total) by mouth daily. 30 tablet 0  clindamycin  (CLEOCIN  T) 1 % external solution 3 times/day as needed-between meals & bedtime.      cyclobenzaprine  (FLEXERIL ) 10 MG tablet Take 10 mg by mouth 3 (three) times daily.     EPINEPHrine  0.3 mg/0.3 mL IJ SOAJ injection Inject 0.3 mg into the muscle as needed for anaphylaxis.     influenza vac split trivalent PF (FLUZONE) 0.5 ML injection Inject 0.5 mLs into the muscle once for 1 dose. 0.5 mL 0   metFORMIN  (GLUCOPHAGE -XR) 500 MG 24 hr tablet Take 3 tablets (1,500 mg total) by mouth daily. 270 tablet 3   metoprolol  succinate (TOPROL -XL) 25 MG 24 hr tablet Take 25 mg by mouth daily.     Multiple Vitamins-Minerals (MULTIVITAMIN WITH MINERALS) tablet Take 1 tablet by mouth daily.      oxyCODONE  10 MG TABS Take 1 tablet (10 mg total) by mouth every 4 (four) hours as needed for severe pain ((score 7 to 10)). 30 tablet 0   PREMPRO 0.45-1.5 MG tablet Take 1 tablet by mouth daily.     rizatriptan  (MAXALT -MLT) 10 MG disintegrating tablet Take 10 mg by mouth daily as needed for migraine.     traMADol (ULTRAM) 50 MG tablet Take 1 tablet (50 mg total) by mouth every 6 (six) hours as needed. 30 tablet 0   traZODone  (DESYREL ) 100 MG tablet Take 400 mg by mouth at bedtime.     triamcinolone  cream (KENALOG ) 0.1 % Apply 1 Application topically 2 (two) times daily as needed (Skin cancer on chest).     TRINTELLIX  20 MG TABS tablet Take 20 mg by mouth daily.     YUVAFEM 10 MCG TABS vaginal tablet Place 1 tablet vaginally 2 (two) times a week.     No current facility-administered medications for this visit.   No results found.  Review of Systems:   A ROS was performed including pertinent positives and negatives as documented in the HPI.  Physical Exam :   Constitutional: NAD and appears stated age Neurological: Alert and oriented Psych: Appropriate affect and cooperative There were no vitals taken for this visit.   Comprehensive Musculoskeletal Exam:    Right shoulder incisions are well-appearing without erythema or drainage.  In the spine position she can forward elevate to 90 degrees with external rotation at the side to 30 degrees.  Distal neurosensory exam intact internal rotation deferred today   Imaging:    I personally reviewed and interpreted the radiographs.   Assessment and Plan:   58 y.o. female 6 weeks status post right shoulder rotator cuff repair doing well.  At this time she is doing extremely well.  She will continue to work on active range of motion I will plan to see her back in 6 weeks  -Return to clinic 6 weeks for reassessment   I personally saw and evaluated the patient, and participated in the management and treatment plan.  Elspeth Parker,  MD Attending Physician, Orthopedic Surgery  This document was dictated using Dragon voice recognition software. A reasonable attempt at proof reading has been made to minimize errors.

## 2024-04-13 DIAGNOSIS — D485 Neoplasm of uncertain behavior of skin: Secondary | ICD-10-CM | POA: Diagnosis not present

## 2024-04-13 DIAGNOSIS — C44519 Basal cell carcinoma of skin of other part of trunk: Secondary | ICD-10-CM | POA: Diagnosis not present

## 2024-04-18 ENCOUNTER — Telehealth (HOSPITAL_BASED_OUTPATIENT_CLINIC_OR_DEPARTMENT_OTHER): Payer: Self-pay | Admitting: Orthopaedic Surgery

## 2024-04-18 NOTE — Telephone Encounter (Signed)
 Patient states that she has a Popeye arm where she had surgery. Best contact [phone]

## 2024-04-19 ENCOUNTER — Ambulatory Visit (INDEPENDENT_AMBULATORY_CARE_PROVIDER_SITE_OTHER): Admitting: Student

## 2024-04-19 DIAGNOSIS — M25511 Pain in right shoulder: Secondary | ICD-10-CM

## 2024-04-19 NOTE — Progress Notes (Signed)
 Chief Complaint: Right shoulder rotator cuff repair 9/29     History of Present Illness:    Shelly Myers is a 58 y.o. female who presents today approximately 7 weeks status post right shoulder rotator cuff repair and biceps tenodesis.  Since yesterday, she reports that she has begun to notice a deformity in her right upper arm.  She does not have any recent injury or popping sensation, however was reaching awkwardly for something yesterday and felt some pain.  PMH/PSH/Family History/Social History/Meds/Allergies:    Past Medical History:  Diagnosis Date   Allergy    Arthritis    Cancer (HCC)    skin cancer   DDD (degenerative disc disease), cervical    GERD (gastroesophageal reflux disease)    Hypertension    Osteopenia    PCOS (polycystic ovarian syndrome)    Past Surgical History:  Procedure Laterality Date   BICEPT TENODESIS Right 02/28/2024   Procedure: TENODESIS, BICEPS;  Surgeon: Genelle Standing, MD;  Location: [facility];  Service: Orthopedics;  Laterality: Right;   BREAST ENHANCEMENT SURGERY     DILATATION & CURETTAGE/HYSTEROSCOPY WITH MYOSURE N/A 08/13/2021   Procedure: DILATATION & CURETTAGE/HYSTEROSCOPY WITH MYOSURE;  Surgeon: Storm Setter, DO;  Location: [facility];  Service: Gynecology;  Laterality: N/A;   HAND SURGERY Bilateral    SHOULDER ARTHROSCOPY WITH ROTATOR CUFF REPAIR Right 02/28/2024   Procedure: ARTHROSCOPY, SHOULDER, WITH ROTATOR CUFF REPAIR;  Surgeon: Genelle Standing, MD;  Location: [facility];  Service: Orthopedics;  Laterality: Right;   TONSILLECTOMY     TRANSFORAMINAL LUMBAR INTERBODY FUSION W/ MIS 2 LEVEL N/A 10/06/2022   Procedure: Lumbar Four-Lumbar Five, Lumbar Five-Sacral One Minimally Invasive Transforaminal Lumbar Interbody Fusion;  Surgeon: Cheryle Debby LABOR, MD;  Location: [facility];  Service: Neurosurgery;  Laterality: N/A;  RM 20 to follow   Social History   Socioeconomic History    Marital status: Married    Spouse name: Not on file   Number of children: 1   Years of education: Not on file   Highest education level: Not on file  Occupational History   Not on file  Tobacco Use   Smoking status: Never   Smokeless tobacco: Never  Vaping Use   Vaping status: Never Used  Substance and Sexual Activity   Alcohol use: Not Currently    Comment: once or twice a year around a holiday   Drug use: Never   Sexual activity: Not on file  Other Topics Concern   Not on file  Social History Narrative   Lives at home with husband   Right handed   Caffeine: 3 cups/day   Social Drivers of Corporate Investment Banker Strain: Not on file  Food Insecurity: Not on file  Transportation Needs: Not on file  Physical Activity: Not on file  Stress: Not on file  Social Connections: Not on file   Family History  Problem Relation Age of Onset   Hypertension Mother    Osteopenia Mother    Diabetes Father    Skin cancer Father    Prostate cancer Father    Esophageal cancer Father    Hypertension Father    Hyperlipidemia Father    Headache Neg Hx    Migraines Neg Hx    No Known Allergies Current Outpatient Medications  Medication Sig Dispense Refill   AIMOVIG 140 MG/ML SOAJ Inject into the skin every 30 (thirty) days.     ALPRAZolam  (XANAX ) 1 MG tablet Take  3 mg by mouth at bedtime.     aspirin  EC 325 MG tablet Take 1 tablet (325 mg total) by mouth daily. 30 tablet 0   clindamycin  (CLEOCIN  T) 1 % external solution 3 times/day as needed-between meals & bedtime.      cyclobenzaprine  (FLEXERIL ) 10 MG tablet Take 10 mg by mouth 3 (three) times daily.     EPINEPHrine  0.3 mg/0.3 mL IJ SOAJ injection Inject 0.3 mg into the muscle as needed for anaphylaxis.     metFORMIN  (GLUCOPHAGE -XR) 500 MG 24 hr tablet Take 3 tablets (1,500 mg total) by mouth daily. 270 tablet 3   metoprolol  succinate (TOPROL -XL) 25 MG 24 hr tablet Take 25 mg by mouth daily.     Multiple Vitamins-Minerals  (MULTIVITAMIN WITH MINERALS) tablet Take 1 tablet by mouth daily.     oxyCODONE  10 MG TABS Take 1 tablet (10 mg total) by mouth every 4 (four) hours as needed for severe pain ((score 7 to 10)). 30 tablet 0   PREMPRO 0.45-1.5 MG tablet Take 1 tablet by mouth daily.     rizatriptan  (MAXALT -MLT) 10 MG disintegrating tablet Take 10 mg by mouth daily as needed for migraine.     traMADol (ULTRAM) 50 MG tablet Take 1 tablet (50 mg total) by mouth every 6 (six) hours as needed. 30 tablet 0   traZODone  (DESYREL ) 100 MG tablet Take 400 mg by mouth at bedtime.     triamcinolone  cream (KENALOG ) 0.1 % Apply 1 Application topically 2 (two) times daily as needed (Skin cancer on chest).     TRINTELLIX  20 MG TABS tablet Take 20 mg by mouth daily.     YUVAFEM 10 MCG TABS vaginal tablet Place 1 tablet vaginally 2 (two) times a week.     No current facility-administered medications for this visit.   No results found.  Review of Systems:   A ROS was performed including pertinent positives and negatives as documented in the HPI.  Physical Exam :   Constitutional: NAD and appears stated age Neurological: Alert and oriented Psych: Appropriate affect and cooperative There were no vitals taken for this visit.   Comprehensive Musculoskeletal Exam:    Right shoulder incisions are well-appearing without erythema or drainage.  Patient demonstrates right shoulder active range of motion 160 degrees flexion, 60 degrees external rotation, and internal rotation to L2.  Full strength and range of motion at the elbow particularly with elbow flexion and forearm supination.   Imaging:    I personally reviewed and interpreted the radiographs.   Assessment and Plan:   58 y.o. female 7 weeks status post right shoulder rotator cuff repair.  She does present today with concerns of a Popeye deformity in the upper arm which she has had previously prior to tenodesis.  On exam today there is some slight difference compared to  contralateral side however does not appear that there has been a full biceps rupture.  Strength and range of motion are very well-maintained, therefore I have recommended some ibuprofen  to help with symptoms and avoiding any aggravating activities for the next days.  If symptoms persist or worsen, we can see her back earlier prior to next follow-up on 12/17.    I personally saw and evaluated the patient, and participated in the management and treatment plan.   Leonce Reveal, PA-C Orthopedics  This document was dictated using Conservation officer, historic buildings. A reasonable attempt at proof reading has been made to minimize errors.

## 2024-04-20 DIAGNOSIS — C44519 Basal cell carcinoma of skin of other part of trunk: Secondary | ICD-10-CM | POA: Diagnosis not present

## 2024-04-23 DIAGNOSIS — M47816 Spondylosis without myelopathy or radiculopathy, lumbar region: Secondary | ICD-10-CM | POA: Diagnosis not present

## 2024-04-23 DIAGNOSIS — M47812 Spondylosis without myelopathy or radiculopathy, cervical region: Secondary | ICD-10-CM | POA: Diagnosis not present

## 2024-05-04 DIAGNOSIS — G43009 Migraine without aura, not intractable, without status migrainosus: Secondary | ICD-10-CM | POA: Diagnosis not present

## 2024-05-04 DIAGNOSIS — M62830 Muscle spasm of back: Secondary | ICD-10-CM | POA: Diagnosis not present

## 2024-05-04 DIAGNOSIS — M47812 Spondylosis without myelopathy or radiculopathy, cervical region: Secondary | ICD-10-CM | POA: Diagnosis not present

## 2024-05-04 DIAGNOSIS — G894 Chronic pain syndrome: Secondary | ICD-10-CM | POA: Diagnosis not present

## 2024-05-15 DIAGNOSIS — F3342 Major depressive disorder, recurrent, in full remission: Secondary | ICD-10-CM | POA: Diagnosis not present

## 2024-05-17 ENCOUNTER — Encounter (HOSPITAL_BASED_OUTPATIENT_CLINIC_OR_DEPARTMENT_OTHER): Admitting: Orthopaedic Surgery

## 2024-06-21 ENCOUNTER — Encounter (HOSPITAL_BASED_OUTPATIENT_CLINIC_OR_DEPARTMENT_OTHER): Admitting: Orthopaedic Surgery

## 2024-08-01 ENCOUNTER — Ambulatory Visit: Admitting: Internal Medicine
